# Patient Record
Sex: Female | Born: 1950 | Race: White | Hispanic: No | Smoking: Former smoker
Health system: Southern US, Community
[De-identification: ages and names within clinical notes are randomized; demographics above are authoritative.]

## PROBLEM LIST (undated history)

## (undated) DIAGNOSIS — A159 Respiratory tuberculosis unspecified: Secondary | ICD-10-CM

## (undated) DIAGNOSIS — E042 Nontoxic multinodular goiter: Secondary | ICD-10-CM

## (undated) DIAGNOSIS — IMO0002 Reserved for concepts with insufficient information to code with codable children: Secondary | ICD-10-CM

## (undated) DIAGNOSIS — F419 Anxiety disorder, unspecified: Secondary | ICD-10-CM

## (undated) DIAGNOSIS — R011 Cardiac murmur, unspecified: Secondary | ICD-10-CM

## (undated) DIAGNOSIS — N809 Endometriosis, unspecified: Secondary | ICD-10-CM

## (undated) DIAGNOSIS — I639 Cerebral infarction, unspecified: Secondary | ICD-10-CM

## (undated) DIAGNOSIS — E279 Disorder of adrenal gland, unspecified: Secondary | ICD-10-CM

## (undated) DIAGNOSIS — M47816 Spondylosis without myelopathy or radiculopathy, lumbar region: Secondary | ICD-10-CM

## (undated) DIAGNOSIS — J189 Pneumonia, unspecified organism: Secondary | ICD-10-CM

## (undated) DIAGNOSIS — I10 Essential (primary) hypertension: Secondary | ICD-10-CM

## (undated) DIAGNOSIS — Z87442 Personal history of urinary calculi: Secondary | ICD-10-CM

## (undated) DIAGNOSIS — R0602 Shortness of breath: Secondary | ICD-10-CM

## (undated) DIAGNOSIS — T7840XA Allergy, unspecified, initial encounter: Secondary | ICD-10-CM

## (undated) DIAGNOSIS — Z8672 Personal history of thrombophlebitis: Secondary | ICD-10-CM

## (undated) DIAGNOSIS — I809 Phlebitis and thrombophlebitis of unspecified site: Secondary | ICD-10-CM

## (undated) DIAGNOSIS — H269 Unspecified cataract: Secondary | ICD-10-CM

## (undated) DIAGNOSIS — Z8679 Personal history of other diseases of the circulatory system: Secondary | ICD-10-CM

## (undated) DIAGNOSIS — I499 Cardiac arrhythmia, unspecified: Secondary | ICD-10-CM

## (undated) DIAGNOSIS — M1990 Unspecified osteoarthritis, unspecified site: Secondary | ICD-10-CM

## (undated) DIAGNOSIS — G709 Myoneural disorder, unspecified: Secondary | ICD-10-CM

## (undated) DIAGNOSIS — F32A Depression, unspecified: Secondary | ICD-10-CM

## (undated) DIAGNOSIS — N3010 Interstitial cystitis (chronic) without hematuria: Secondary | ICD-10-CM

## (undated) DIAGNOSIS — R112 Nausea with vomiting, unspecified: Secondary | ICD-10-CM

## (undated) DIAGNOSIS — M4800 Spinal stenosis, site unspecified: Secondary | ICD-10-CM

## (undated) DIAGNOSIS — I739 Peripheral vascular disease, unspecified: Secondary | ICD-10-CM

## (undated) DIAGNOSIS — Z9889 Other specified postprocedural states: Secondary | ICD-10-CM

## (undated) DIAGNOSIS — F329 Major depressive disorder, single episode, unspecified: Secondary | ICD-10-CM

## (undated) HISTORY — DX: Cardiac murmur, unspecified: R01.1

## (undated) HISTORY — DX: Endometriosis, unspecified: N80.9

## (undated) HISTORY — DX: Major depressive disorder, single episode, unspecified: F32.9

## (undated) HISTORY — DX: Personal history of thrombophlebitis: Z86.72

## (undated) HISTORY — DX: Spinal stenosis, site unspecified: M48.00

## (undated) HISTORY — DX: Essential (primary) hypertension: I10

## (undated) HISTORY — DX: Personal history of other diseases of the circulatory system: Z86.79

## (undated) HISTORY — DX: Myoneural disorder, unspecified: G70.9

## (undated) HISTORY — DX: Reserved for concepts with insufficient information to code with codable children: IMO0002

## (undated) HISTORY — PX: EYE SURGERY: SHX253

## (undated) HISTORY — DX: Unspecified osteoarthritis, unspecified site: M19.90

## (undated) HISTORY — DX: Allergy, unspecified, initial encounter: T78.40XA

## (undated) HISTORY — DX: Spondylosis without myelopathy or radiculopathy, lumbar region: M47.816

## (undated) HISTORY — DX: Anxiety disorder, unspecified: F41.9

## (undated) HISTORY — PX: SPINE SURGERY: SHX786

## (undated) HISTORY — DX: Unspecified cataract: H26.9

## (undated) HISTORY — PX: ABDOMINAL HYSTERECTOMY: SHX81

## (undated) HISTORY — DX: Depression, unspecified: F32.A

## (undated) HISTORY — PX: LITHOTRIPSY: SUR834

## (undated) HISTORY — PX: TUBAL LIGATION: SHX77

---
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
## 1976-09-22 DIAGNOSIS — I739 Peripheral vascular disease, unspecified: Secondary | ICD-10-CM

## 1976-09-22 HISTORY — DX: Peripheral vascular disease, unspecified: I73.9

## 1994-09-22 HISTORY — PX: NASAL SINUS SURGERY: SHX719

## 1998-01-16 ENCOUNTER — Other Ambulatory Visit: Admission: RE | Admit: 1998-01-16 | Discharge: 1998-01-16 | Payer: Self-pay | Admitting: Obstetrics and Gynecology

## 1998-04-12 ENCOUNTER — Other Ambulatory Visit: Admission: RE | Admit: 1998-04-12 | Discharge: 1998-04-12 | Payer: Self-pay | Admitting: Obstetrics and Gynecology

## 1998-06-15 ENCOUNTER — Other Ambulatory Visit: Admission: RE | Admit: 1998-06-15 | Discharge: 1998-06-15 | Payer: Self-pay | Admitting: Obstetrics and Gynecology

## 1999-04-15 ENCOUNTER — Other Ambulatory Visit: Admission: RE | Admit: 1999-04-15 | Discharge: 1999-04-15 | Payer: Self-pay | Admitting: Obstetrics and Gynecology

## 2000-08-11 ENCOUNTER — Other Ambulatory Visit: Admission: RE | Admit: 2000-08-11 | Discharge: 2000-08-11 | Payer: Self-pay | Admitting: Obstetrics and Gynecology

## 2002-05-31 ENCOUNTER — Encounter: Payer: Self-pay | Admitting: Family Medicine

## 2002-05-31 ENCOUNTER — Encounter: Admission: RE | Admit: 2002-05-31 | Discharge: 2002-05-31 | Payer: Self-pay | Admitting: Family Medicine

## 2002-09-22 HISTORY — PX: TOTAL ABDOMINAL HYSTERECTOMY W/ BILATERAL SALPINGOOPHORECTOMY: SHX83

## 2002-09-22 HISTORY — PX: LAMINOTOMY: SHX998

## 2002-09-23 ENCOUNTER — Encounter: Payer: Self-pay | Admitting: Obstetrics and Gynecology

## 2002-09-23 ENCOUNTER — Encounter: Admission: RE | Admit: 2002-09-23 | Discharge: 2002-09-23 | Payer: Self-pay | Admitting: Obstetrics and Gynecology

## 2002-11-22 ENCOUNTER — Encounter: Admission: RE | Admit: 2002-11-22 | Discharge: 2002-11-22 | Payer: Self-pay | Admitting: Obstetrics and Gynecology

## 2002-11-22 ENCOUNTER — Encounter: Payer: Self-pay | Admitting: Obstetrics and Gynecology

## 2003-01-06 ENCOUNTER — Encounter: Admission: RE | Admit: 2003-01-06 | Discharge: 2003-01-06 | Payer: Self-pay | Admitting: Family Medicine

## 2003-01-06 ENCOUNTER — Encounter: Payer: Self-pay | Admitting: Family Medicine

## 2003-01-23 ENCOUNTER — Encounter: Payer: Self-pay | Admitting: Obstetrics and Gynecology

## 2003-01-23 ENCOUNTER — Ambulatory Visit (HOSPITAL_COMMUNITY): Admission: RE | Admit: 2003-01-23 | Discharge: 2003-01-23 | Payer: Self-pay | Admitting: Obstetrics and Gynecology

## 2003-02-20 ENCOUNTER — Encounter: Payer: Self-pay | Admitting: Neurosurgery

## 2003-02-23 ENCOUNTER — Encounter: Payer: Self-pay | Admitting: Neurosurgery

## 2003-02-23 ENCOUNTER — Ambulatory Visit (HOSPITAL_COMMUNITY): Admission: RE | Admit: 2003-02-23 | Discharge: 2003-02-23 | Payer: Self-pay | Admitting: Neurosurgery

## 2003-05-02 ENCOUNTER — Ambulatory Visit (HOSPITAL_COMMUNITY): Admission: RE | Admit: 2003-05-02 | Discharge: 2003-05-02 | Payer: Self-pay | Admitting: Obstetrics and Gynecology

## 2003-05-02 ENCOUNTER — Encounter: Payer: Self-pay | Admitting: Obstetrics and Gynecology

## 2003-08-18 ENCOUNTER — Ambulatory Visit (HOSPITAL_COMMUNITY): Admission: RE | Admit: 2003-08-18 | Discharge: 2003-08-18 | Payer: Self-pay | Admitting: Obstetrics and Gynecology

## 2003-08-22 ENCOUNTER — Inpatient Hospital Stay (HOSPITAL_COMMUNITY): Admission: RE | Admit: 2003-08-22 | Discharge: 2003-08-24 | Payer: Self-pay | Admitting: Obstetrics and Gynecology

## 2003-08-22 ENCOUNTER — Encounter (INDEPENDENT_AMBULATORY_CARE_PROVIDER_SITE_OTHER): Payer: Self-pay

## 2003-08-22 ENCOUNTER — Encounter (INDEPENDENT_AMBULATORY_CARE_PROVIDER_SITE_OTHER): Payer: Self-pay | Admitting: Specialist

## 2003-09-23 DIAGNOSIS — I639 Cerebral infarction, unspecified: Secondary | ICD-10-CM

## 2003-09-23 HISTORY — DX: Cerebral infarction, unspecified: I63.9

## 2003-11-14 ENCOUNTER — Other Ambulatory Visit: Admission: RE | Admit: 2003-11-14 | Discharge: 2003-11-14 | Payer: Self-pay | Admitting: Obstetrics and Gynecology

## 2004-01-04 ENCOUNTER — Encounter: Admission: RE | Admit: 2004-01-04 | Discharge: 2004-01-04 | Payer: Self-pay | Admitting: Family Medicine

## 2004-09-19 ENCOUNTER — Encounter: Admission: RE | Admit: 2004-09-19 | Discharge: 2004-09-19 | Payer: Self-pay | Admitting: Specialist

## 2004-12-09 ENCOUNTER — Other Ambulatory Visit: Admission: RE | Admit: 2004-12-09 | Discharge: 2004-12-09 | Payer: Self-pay | Admitting: Obstetrics and Gynecology

## 2006-05-01 ENCOUNTER — Other Ambulatory Visit: Admission: RE | Admit: 2006-05-01 | Discharge: 2006-05-01 | Payer: Self-pay | Admitting: Obstetrics and Gynecology

## 2007-03-17 ENCOUNTER — Ambulatory Visit (HOSPITAL_COMMUNITY): Admission: RE | Admit: 2007-03-17 | Discharge: 2007-03-17 | Payer: Self-pay | Admitting: Family Medicine

## 2008-03-23 ENCOUNTER — Other Ambulatory Visit: Admission: RE | Admit: 2008-03-23 | Discharge: 2008-03-23 | Payer: Self-pay | Admitting: Obstetrics and Gynecology

## 2009-12-05 ENCOUNTER — Other Ambulatory Visit: Admission: RE | Admit: 2009-12-05 | Discharge: 2009-12-05 | Payer: Self-pay | Admitting: Obstetrics and Gynecology

## 2010-10-13 ENCOUNTER — Encounter: Payer: Self-pay | Admitting: Family Medicine

## 2010-10-13 ENCOUNTER — Encounter: Payer: Self-pay | Admitting: Specialist

## 2011-02-07 NOTE — Discharge Summary (Signed)
NAME:  Michele [name], Michele [name]                          ACCOUNT NO.:  [id number]   MEDICAL RECORD NO.:  [id number]                   PATIENT TYPE:  INP   LOCATION:  9308                                 FACILITY:  [facility]   PHYSICIAN:  Artist Pais, M.D.                 DATE OF BIRTH:  [birth year]   DATE OF ADMISSION:  08/22/2003  DATE OF DISCHARGE:  08/24/2003                                 DISCHARGE SUMMARY   CURRENT HISTORY:  The patient is a 60 year old Caucasian female para 2  admitted for a total abdominal hysterectomy and bilateral salpingo-  oophorectomy after she was found to have a large left adnexal mass.  It  showed simple sonographic characteristics measuring 4.9 x 3.6 x 5.3 cm but  did not resolve.  Her CA125 was 5.5.  For further details please see the  dictated History and Physical.   HOSPITAL COURSE:  The patient was admitted and underwent a TAH/BSO without  difficulty.  Peritoneal washings showed no malignant cells and the final  diagnosis was a benign ovarian serous cystadenoma, benign fallopian tubes,  some slight cervicitis, weakly proliferative endometrium, and intramural  leiomyoma.  The right and left ovaries were benign but the mass was a benign  serous cystadenoma.  The patient was admitted after surgery and on the  evening of surgery she achieving excellent analgesia.  She had no complaints  except for some gas pain.  Urine output was excellent and vital signs were  stable.  Her blood pressure was slightly elevated which resolved when she  received her Serzone and nicotine patches.  On postoperative day #1 she was  feeling very well.  Her IV was discontinued and p.o. analgesics were  started.  Her white count was 11,800 and hemoglobin was 11.6 with a 234,000  platelet count.  There was no CVA tenderness, no calf tenderness, and very  scant vaginal drainage.  On postoperative day #2 the patient wanted to go  home.  Lungs were clear.  Cardiac exam revealed the heart to be  regular rate  and rhythm.  She had very scant vaginal bleeding.  Her abdomen was soft and  nontender and she was passing flatus.  She did tolerate a regular diet.  She  was subsequently discharged home on August 24, 2003.  She was given  extensive discharge instructions include a prescription for Tylox dispense  #30 to take one to two q.4-6h. as needed for pain.  She was also to take her  Serzone per her bottle directions.  She will call Dr. Leonides Sake for her  nicotine patch and use glycerin suppositories for constipation.  She is  urged to ambulate daily to decrease the risk of a DVT or pulmonary embolism.  She agreed to call if her temperature was greater than 101 persistently or  if she had heavy vaginal bleeding.  She did receive the postoperative  hysterectomy pamphlet,  Recovering From Your Surgery which gave her further  instructions.  She was urged to return to the office within a week to have  her staples removed.  Incision was noted to be clean, dry, and intact.                                               Artist Pais, M.D.    DC/MEDQ  D:  09/22/2003  T:  09/22/2003  Job:  [id number]   cc:   Holley Bouche, M.D.  [address]  [place], [place] [address]  Fax: [phone]

## 2011-02-07 NOTE — H&P (Signed)
NAME:  Michele [name], Michele [name]                          ACCOUNT NO.:  [id number]   MEDICAL RECORD NO.:  [id number]                   PATIENT TYPE:   LOCATION:                                       [facility]   PHYSICIAN:  Artist Pais, M.D.                 DATE OF BIRTH:  Sep 14, 1951   DATE OF ADMISSION:  08/22/2003  DATE OF DISCHARGE:                                HISTORY & PHYSICAL   HISTORY OF PRESENT ILLNESS:  The patient is a 60 year old Caucasian female,  para 2, admitted for a total abdominal hysterectomy and bilateral salpingo-  oophorectomy.  The patient was found to have a left adnexal mass which was  palpated during physical exam.  This was confirmed by pelvic ultrasound on  September 23, 2002.  The left ovary was found to have a cyst, which was simple  in sonographic characteristics, measuring 4.9 x 3.6 x 5.3 cm.  This has been  monitored and has been stable, but the patient has noted increasing pain and  desires this to be removed.  Because she is menopausal and this is an  ovarian neoplasm of uncertain etiology, but thought to be benign, she is  admitted for a total abdominal hysterectomy and bilateral salpingo-  oophorectomy.  The risks of surgery, including anesthetic complication,  hemorrhage, infection, damage to adjacent structures, including bladder,  bowel, blood vessels, or ureter, were discussed with the patient.  She is  made aware that she has an increased risk for bladder damage due to her  history of cesarean section.  She expresses understanding of and acceptance  of these risks.  She has desired not to have surgery before now because she  wanted to recuperate from her back surgery.  When I saw her last week for  her preoperative visit, she says that this still was present even before I  examined her because she continues to have pain.  She fully understands that  even with removal of this ovarian mass that she could still have pain.  Initially her CA125 was 5.5,  which is normal.  I repeated her CA125 on  August 14, 2003.  This was found to be normal at 5.3.  A repeat pelvic  ultrasound on August 18, 2003, revealed the left ovarian cyst to be 4.9 x  4.6 x 3.4 cm and this was found to be stable.  Thus, my suspicion that this  is in any way malignant has decreased given that one dimension has appeared  to perhaps decreased in size, but given the fact that this just appears to  be a simple cyst with all benign ultrasound characteristics and a normal  CA125, this was elected to be performed by me as a benign gynecologic  surgeon and not referred to a cancer specialist.  The patient is fully  amenable with this plan.  She also has a distant history of endometriosis.  OBSTETRICAL HISTORY:  Menarche at age 14.  Contraception NA.  The patient  became menopausal in September of 2000.  She has no history of an abnormal  Pap.  Her last Pap performed was on September 12, 2002, and was found to be  within normal limits.   PAST MEDICAL HISTORY:  1. Depression.  2. Anxiety.  3. History of phlebitis.  This was not a DVT, but was just some inflammation     around the vein from where she had an IV site.   PAST SURGICAL HISTORY:  1. Sinus surgery in 1996.  2. Cesarean section in 1985.  3. Spontaneous vaginal delivery in 1986.  4. T&A in 1958.  5. Appendectomy in 1957.  6. Microdiskectomy done in the summer of this year.   CURRENT MEDICATIONS:  Serzone and Flexeril p.r.n.   ALLERGIES:  LATEX.   FAMILY HISTORY:  There is no family history of colon or ovarian cancer.  The  patient's mother has hypertension and heart disease, as well as anxiety and  arthritis.  She also has a history of cervical cancer.  The patient's father  is 57 with prostate cancer, hypertension, and heart disease.  One sister,  age 67, with premenopausal breast cancer.  One sister, age 51, with  hepatitis C, fibromyalgia, hypertension, and heart disease.  Two brothers,  ages 54 and  92, alive and well.  Her 105 year old brother has a history of  hemochromatosis.   SOCIAL HISTORY:  The patient is an Print production planner for [employer].  Tobacco:  The patient smokes one-half pack per day of cigarettes.  She is  attempting smoking cessation.  Alcohol:  Occasional.   REVIEW OF SYSTEMS:  Noncontributory, except as noted above.  Denies  headaches, visual changes, chest pain, shortness of breath, abdominal pain,  change in bowel habits, unintentional weight loss, dysuria, urgency,  frequency, vaginal pruritus or discharge, and pain or bleeding with  intercourse.   PHYSICAL EXAMINATION:  GENERAL APPEARANCE:  A well-developed, Caucasian  female.  VITAL SIGNS:  Blood pressure 126/84, heart rate 58.  WEIGHT:  154 pounds.  HEIGHT:  5 feet 3-1/2 inches.  HEENT:  Normal.  NECK:  Without thyromegaly, adenopathy, or nodules.  CHEST:  Clear to auscultation.  BREASTS:  Symmetrical without masses.  No dimples, retraction, or nipple  discharge.  CARDIAC:  Regular rate and rhythm without murmurs, rubs, or gallops.  ABDOMEN:  Soft and nontender.  No hepatosplenomegaly or masses.  EXTREMITIES:  No cyanosis, clubbing, or edema.  NEUROLOGIC:  Oriented x 3.  Grossly normal.  PELVIC:  Normal external female genitalia.  No vulvar, vaginal, or cervical  lesions.  Pap smear as mentioned above performed in December of 2003 was  within normal limits.  Bimanual examination reveals the uterus to be normal,  mobile, and nontender, but I am able to still palpate her left adnexal mass,  which is smooth and palpates to be approximately 4-5 cm, which was initially  noted on her September 12, 2002, exam.  RECTAL:  Confirms pelvic exam.  No masses palpated.   IMPRESSION AND PLAN:  The patient is a 60 year old Caucasian female with an  18-month history of a 5 cm ovarian simple cyst which has been stable, but which causes the patient significant pain and she desires removal of this  mass.  Because the  patient has had surgery before and with each entry into  the abdomen there is an increased risk of bowel injury and given the fact  that she is menopausal, I have counseled her to have a total abdominal  hysterectomy and bilateral salpingo-oophorectomy, which she very much  prefers to have.  All risks have been explained to her.  She also will do a  bowel prep and is amenable with doing this.  All questions have been  answered and the patient desires to proceed with surgery.                                               Artist Pais, M.D.    DC/MEDQ  D:  08/21/2003  T:  08/21/2003  Job:  [id number]

## 2011-02-07 NOTE — Op Note (Signed)
NAME:  Michele [name], Michele [name]                          ACCOUNT NO.:  [id number]   MEDICAL RECORD NO.:  [id number]                   PATIENT TYPE:  INP   LOCATION:  9399                                 FACILITY:  [facility]   PHYSICIAN:  Artist Pais, M.D.                 DATE OF BIRTH:  Nov 06, 1950   DATE OF PROCEDURE:  DATE OF DISCHARGE:                                 OPERATIVE REPORT   PREOPERATIVE DIAGNOSIS:  Persistent left ovarian cyst, left pelvic pain and  dyspareunia as a result of the cyst.   POSTOPERATIVE DIAGNOSIS:  Persistent left ovarian cyst, left pelvic pain and  dyspareunia as a result of the cyst; left ovarian cyst found to be benign.   PROCEDURE:  Total abdominal hysterectomy, bilateral salpingo-oophorectomy.   ANESTHESIA:  General endotracheal.   SURGEON:  Artist Pais, M.D., Ph.D.   ASSISTANT:  Leonette Most A. Delcambre, MD   FLUIDS:  1900 cc of Crystalloid plus 1 g of Ancef IV.   DRAINS:  Foley.   ESTIMATED BLOOD LOSS:  150 cc.   URINE OUTPUT:  150 cc.   COMPLICATIONS:  None.   FINDINGS:  Benign left ovarian cyst on frozen section, also endometriotic  implants in the cul-de-sac which resulted in obliteration of the cul-de-sac.   DESCRIPTION OF OPERATION:  The patient was brought to the operating room and  laid flat on the operating room table.  After induction of adequate general  endotracheal anesthesia, the patient was prepped and draped in the usual  sterile fashion and the Foley catheter was placed.  Everything was latex-  free as she has a latex allergy.  The patient was subsequently placed in the  supine position and prepped and draped in the usual sterile fashion.  A  Foley catheter was placed at this time.  The patient had a previous cesarean  section and a Pfannenstiel incision was made through her cesarean section  incision and carried down to the fascia.  The fascia was scored in the  midline and extended bilaterally using Mayo scissors.  It was then  separated  free from the underlying muscle.  The patient was placed in Trendelenburg  and the peritoneum was entered very gently and carefully, taking care to  avoid bowel or other abdominal contents.  This was done in a very careful  fashion.   The peritoneal incision was extended superiorly and then inferiorly down to  the bladder edge.  The abdomen was then explored after washings were taken.  The liver edge was noted to be smooth.  There were no enlarged pelvic or  para-aortic nodes.  The left ovarian mass was noted to be mobile and benign  in appearance.  There was no evidence of any peritoneal disease.   Subsequently the O'Connor-O'Sullivan retractor was placed and the bowel was  prepped out of the operative field.  All Kelly clamps were placed at either  angle  of the uterus and the uterus was elevated to the area of the abdominal  incision.  The right round ligament was identified, ligated with a figure-of-  eight suture of 0 Monocryl, divided with the Bovie, and the anterior leaf of  the broad ligament was divided to the area overlying the internal os.  This  was accomplished on the left as well in a similar fashion.  The ureter was  identified on the right and noted to be well down and away from the ovary.  Subsequently a clear space was developed in the posterior leaf of the broad  ligament and a curved Heaney clamp was placed across the infundibulopelvic  ligament.  This was then divided, and a free tie of 0 Monocryl was placed  and a subsequent suture of 0 Monocryl was placed.  Excellent hemostasis was  noted at the infundibulopelvic ligament pedicle.  This was accomplished on  the left in a similar fashion.   Because of the patient's cesarean section, the bladder was noted to be very  adherent, but using very careful sharp dissection and just a small amount of  blunt dissection, the bladder was taken down off the cervix.  This proceeded  very tediously in order to mobilize  the bladder off the cervix.  Great care  was taken to ensure no implant cystotomy.  Subsequently the vessels were  skeletonized on the right and a curved Heaney clamp was used to clamp the  vessels on the right, which were cut and ligated with a figure-of-eight  suture of 0 Monocryl.  This was accomplished on the left in a similar  fashion.  Subsequent cardinal ligament bites were taken and ligated with  sutures of 0 Monocryl.  The bladder was noted to be well down distal to the  cervix.  It should be noted that much of the operative time was used to  mobilize the bladder off of the cervix.  Subsequently a curved Heaney was  placed on the left at the uterosacral ligament, and a figure-of-eight suture  of 0 Monocryl was placed after dividing this from the adjacent tissues.  The  vagina was noted to have been entered.  This was accomplished on the right  in a similar fashion.  A curved Heaney was placed across the uterosacral  ligament at the distal end of the cervix, and the vagina was then entered  after the tissue was divided.  A figure-of-eight suture of 0 Monocryl was  placed.  Using Satinsky scissors, the cervix was separated from the vaginal  tissue.  Kocher clamps were placed on the vaginal cuff and the vaginal cuff  was closed using figure-of-eight sutures of 0 Monocryl.  Two figure-of-eight  angle sutures of 0 Monocryl were placed at either angle, ensuring with great  care that any raw tissues of the vaginal cuff were incorporated into the  angle suture.  In closing the vagina with figure-of-eight sutures of 0  Monocryl, great care was taken to ensure that there were no raw edges of  vagina to decrease the risk of bleeding.   Subsequently the pelvis was irrigated copiously with warm lactated Ringer's  and all pedicles were inspected and noted to be extremely hemostatic.  There was noted to be one oozing area at the posterior peritoneum and the  uterosacral ligaments were plicated,  incorporating this raw area of  posterior peritoneum in a figure-of-eight suture of 0 Monocryl with  subsequent excellent hemostasis noted.  There were noted to be  endometriotic  implants in the cul-de-sac which almost obliterated the cul-de-sac, but the  endometriotic implants were removed with the specimen.  The pathologist then  phoned in that the cyst was noted to be benign.   At that point, the patient was taken out of Trendelenburg after noting that  there was excellent hemostasis.  The O'Connor-O'Sullivan retractor was  removed.  It should be noted that the bladder blade and abdominal wall  retractor had been placed with the O'Connor-O'Sullivan retractor.  The bowel  packs were removed and all instruments, sponge and needle counts were  correct at that point.  Hemostats were placed over the peritoneum and the  peritoneum was closed using a simple running suture of 0 Monocryl.  The  subfascial areas were examined for evidence of hemostasis and excellent  hemostasis was achieved using cautery; however, the area was noted to be  mostly hemostatic.  The fascia was then closed using two sutures of 0 PDS  with each suture anchored at either angle of the incision, run through the  midline and tied.  Excellent fascial closure was ensured.   Subsequently the subcutaneous tissue was irrigated copiously with warm  lactated Ringer's and excellent hemostasis was achieved using cautery.  The  skin was closed with staples.  A pressure dressing was placed using paper  tape due to the patient's sensitivity.   The patient tolerated the procedure well without apparent complications and  was transferred to the recovery room in stable condition after all  instrument, sponge, and needle counts were correct.                                               Artist Pais, M.D.    DC/MEDQ  D:  08/22/2003  T:  08/22/2003  Job:  [id number]   cc:   [facility]  [address] [place]. [address].

## 2011-02-07 NOTE — Op Note (Signed)
NAME:  Michele [name], Michele [name]                          ACCOUNT NO.:  [id number]   MEDICAL RECORD NO.:  [id number]                   PATIENT TYPE:  OIB   LOCATION:  2871                                 FACILITY:  [facility]   PHYSICIAN:  Reinaldo Meeker, M.D.              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   OPERATION PERFORMED:  Right L5-S1 interlaminar laminotomy for excision of  herniated disk with operating microscope.   SECONDARY PROCEDURE:  Microdissection of L5-S1 disk and S1 nerve root.   SURGEON:  Reinaldo Meeker, M.D.   ASSISTANT:  Kathaleen Maser. Pool, M.D.   ANESTHESIA:  General.   DESCRIPTION OF PROCEDURE:  After being placed in prone position, the  patient's back was prepped and draped in the usual sterile fashion.  A  localizing x-ray was taken prior to incision to identify the appropriate  level.  A midline incision was made above the spinous processes of L5 and  S1.  Using Bovie cutting current, the incision was carried down to spinous  processes.  Subperiosteal dissection was then carried out on the right side  of the spinous processes and lamina and the McCullough self-retaining  retractor was placed for exposure.  A second x-ray showed approach to the  appropriate level.  Using a high speed drill the inferior one third of the  L5 lamina and the medial one third of the facet joint were removed.  It was  then used to remove the superior one half of the S1 lamina.  Residual bone  and ligamentum flavum were removed in a piecemeal fashion.  The microscope  was draped and brought into the field and used for the remainder of the  case.  Using microdissection technique, the lateral aspect of the thecal sac  and S1 nerve root were identified.  Further coagulation was carried  out down  to the floor of the canal to identify the L5-S1 disk which was found to be  markedly herniated beneath the nerve root.  After coagulating out the  annulus, the annulus was incised with a 15 blade.  Using pituitary rongeurs  and curets, a very thorough disk space cleanout was then carried out and at  the same time great care was taken to avoid injury to the neural elements.  This was successfully done.  At this point inspection was carried out in all  directions for any evidence of residual compression and none could be  identified.  Large amounts of irrigation were carried out and any bleeding  controlled with bipolar coagulation and Gelfoam.  The wound was then closed  using interrupted Vicryl in the muscle, fascia, subcutaneous and  subcuticular tissues and staples on the  skin.  A sterile dressing was then  applied and the patient was extubated and taken to the recovery room in  stable condition.                                                Reinaldo Meeker, M.D.    ROK/MEDQ  D:  02/23/2003  T:  02/23/2003  Job:  [id number]

## 2012-01-07 ENCOUNTER — Encounter (HOSPITAL_COMMUNITY): Payer: Self-pay | Admitting: *Deleted

## 2012-01-07 ENCOUNTER — Emergency Department (HOSPITAL_COMMUNITY)
Admission: EM | Admit: 2012-01-07 | Discharge: 2012-01-07 | Discharge status: Against Medical Advice | Payer: Self-pay | Attending: Emergency Medicine | Admitting: Emergency Medicine

## 2012-01-07 DIAGNOSIS — M545 Low back pain, unspecified: Secondary | ICD-10-CM | POA: Insufficient documentation

## 2012-01-07 HISTORY — DX: Interstitial cystitis (chronic) without hematuria: N30.10

## 2012-01-07 HISTORY — DX: Reserved for concepts with insufficient information to code with codable children: IMO0002

## 2012-01-07 NOTE — ED Notes (Signed)
Pt has hx of bulging disc - pt reports getting up from air mattress and felt a "pop" in lower back.  C/o pain to area.

## 2012-01-07 NOTE — ED Notes (Signed)
Pt also reports active kidney stone with hematuria.

## 2012-04-01 ENCOUNTER — Ambulatory Visit
Admission: RE | Admit: 2012-04-01 | Discharge: 2012-04-01 | Disposition: A | Discharge status: Home / Self Care | Payer: 59 | Source: Ambulatory Visit | Attending: Physician Assistant | Admitting: Physician Assistant

## 2012-04-01 ENCOUNTER — Other Ambulatory Visit: Payer: Self-pay | Admitting: Physician Assistant

## 2012-04-01 DIAGNOSIS — R05 Cough: Secondary | ICD-10-CM

## 2012-05-07 ENCOUNTER — Institutional Professional Consult (permissible substitution): Payer: 59 | Admitting: Internal Medicine

## 2012-06-10 ENCOUNTER — Other Ambulatory Visit (HOSPITAL_COMMUNITY)
Admission: RE | Admit: 2012-06-10 | Discharge: 2012-06-10 | Disposition: A | Discharge status: Home / Self Care | Payer: 59 | Source: Ambulatory Visit | Attending: Family Medicine | Admitting: Family Medicine

## 2012-06-10 ENCOUNTER — Other Ambulatory Visit: Payer: Self-pay | Admitting: Physician Assistant

## 2012-06-10 DIAGNOSIS — Z0000 Encounter for general adult medical examination without abnormal findings: Secondary | ICD-10-CM | POA: Insufficient documentation

## 2013-08-31 DIAGNOSIS — Z8679 Personal history of other diseases of the circulatory system: Secondary | ICD-10-CM

## 2013-08-31 HISTORY — DX: Personal history of other diseases of the circulatory system: Z86.79

## 2013-09-21 ENCOUNTER — Other Ambulatory Visit: Payer: Self-pay

## 2013-09-21 ENCOUNTER — Encounter (INDEPENDENT_AMBULATORY_CARE_PROVIDER_SITE_OTHER): Payer: BC Managed Care – PPO

## 2013-09-21 ENCOUNTER — Encounter: Payer: Self-pay | Admitting: *Deleted

## 2013-09-21 DIAGNOSIS — R42 Dizziness and giddiness: Secondary | ICD-10-CM

## 2013-09-21 DIAGNOSIS — I499 Cardiac arrhythmia, unspecified: Secondary | ICD-10-CM

## 2013-09-21 DIAGNOSIS — I498 Other specified cardiac arrhythmias: Secondary | ICD-10-CM

## 2013-09-21 NOTE — Progress Notes (Signed)
Patient ID: Michele [name], female   DOB: 06/07/1951, 62 y.o.   MRN: [id number] E-Cardio 48 hour holter monitor applied to patient.

## 2013-10-11 ENCOUNTER — Other Ambulatory Visit: Payer: Self-pay | Admitting: *Deleted

## 2013-10-11 ENCOUNTER — Encounter: Payer: Self-pay | Admitting: *Deleted

## 2013-10-11 ENCOUNTER — Encounter: Payer: Self-pay | Admitting: Cardiology

## 2013-10-11 ENCOUNTER — Encounter (INDEPENDENT_AMBULATORY_CARE_PROVIDER_SITE_OTHER): Payer: Self-pay

## 2013-10-11 ENCOUNTER — Ambulatory Visit (INDEPENDENT_AMBULATORY_CARE_PROVIDER_SITE_OTHER): Payer: BC Managed Care – PPO | Admitting: Cardiology

## 2013-10-11 VITALS — BP 136/72 | HR 121 | Ht 63.0 in | Wt 127.0 lb

## 2013-10-11 DIAGNOSIS — Z8673 Personal history of transient ischemic attack (TIA), and cerebral infarction without residual deficits: Secondary | ICD-10-CM | POA: Insufficient documentation

## 2013-10-11 DIAGNOSIS — R42 Dizziness and giddiness: Secondary | ICD-10-CM

## 2013-10-11 DIAGNOSIS — I951 Orthostatic hypotension: Secondary | ICD-10-CM

## 2013-10-11 DIAGNOSIS — R000 Tachycardia, unspecified: Secondary | ICD-10-CM

## 2013-10-11 DIAGNOSIS — G459 Transient cerebral ischemic attack, unspecified: Secondary | ICD-10-CM | POA: Insufficient documentation

## 2013-10-11 DIAGNOSIS — I498 Other specified cardiac arrhythmias: Secondary | ICD-10-CM

## 2013-10-11 DIAGNOSIS — G90A Postural orthostatic tachycardia syndrome (POTS): Secondary | ICD-10-CM | POA: Insufficient documentation

## 2013-10-11 DIAGNOSIS — I493 Ventricular premature depolarization: Secondary | ICD-10-CM | POA: Insufficient documentation

## 2013-10-11 DIAGNOSIS — I4949 Other premature depolarization: Secondary | ICD-10-CM

## 2013-10-11 MED ORDER — FLUDROCORTISONE ACETATE 0.1 MG PO TABS: 0.2000 mg | ORAL_TABLET | Freq: Every day | ORAL | Status: DC

## 2013-10-11 MED ORDER — METOPROLOL TARTRATE 25 MG PO TABS: 25.0000 mg | ORAL_TABLET | Freq: Two times a day (BID) | ORAL | Status: DC

## 2013-10-11 MED ORDER — FLUDROCORTISONE ACETATE 0.1 MG PO TABS: 0.1000 mg | ORAL_TABLET | Freq: Every day | ORAL | Status: DC

## 2013-10-11 NOTE — Progress Notes (Signed)
Patient ID: Michele [name], female   DOB: [birth year], 63 y.o.   MRN: [id number] PCP: Dr. 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  For the last 6 months, she has had spells of profuse sweating.  She has also felt frequent palpitations.  Holter monitor showed very frequent (11.6% of total) PVCs.  Main complaint, however, has been lightheadedness with ambulation.  She will note lightheadedness when she stands up and walks across a room.  She will feel her heart pounding.  Sometimes her vision will "go black" and she will have to lean against a wall.  She has not passed out.  She does not get chest pain but does feel a "weird" feeling in her chest when she is walking.  She attributes this to her heart racing/pounding.   She had orthostatics done in the office today.  Blood pressure did not fall with standing, but HR did increase from 87 => 120 (sinus tachycardia).   Labs (12/14): K 4.3, creatinine 0.82, TSH normal, free T4 normal  PMH: 1. Suspected POTS. 2. Depression 3. TAH/BSO 4. OA spine 5. ? TIA in 2003.  6. Stress test 2-3 years ago normal per patient's report.  7. PVCs: Holter (12/14) with frequent PVCs, 11.6% of total beats.   FH: Grandmother with PCM, grandfather with MI and CVA.  SH: Quit smoking in 7/03, rare ETOH, widow with 2 children, lives in [place], unemployed.   ROS: All systems reviewed and negative except as per HPI.   Current Outpatient Prescriptions  Medication Sig Dispense Refill  . ALPRAZolam (XANAX) 0.5 MG tablet Take 1 tablet by mouth as needed.      Marland Kitchen HYDROcodone-acetaminophen (NORCO) 10-325 MG per tablet Take 1 tablet by mouth every 6 (six) hours as needed.      Marland Kitchen albuterol (PROVENTIL HFA;VENTOLIN HFA) 108 (90 BASE) MCG/ACT inhaler Inhale 2 puffs into the lungs every 4 (four) hours as needed for wheezing or shortness of breath.      Marland Kitchen aspirin EC 81 MG tablet Take 1 tablet  (81 mg total) by mouth daily.      . cetirizine (ZYRTEC) 10 MG tablet Take 10 mg by mouth daily.      . fludrocortisone (FLORINEF) 0.1 MG tablet Take 1 tablet (0.1 mg total) by mouth daily.  30 tablet  3  . fluticasone (FLONASE) 50 MCG/ACT nasal spray Place 2 sprays into both nostrils daily.      . Fluticasone-Salmeterol (ADVAIR) 250-50 MCG/DOSE AEPB Inhale 1 puff into the lungs 2 (two) times daily.      . metoprolol tartrate (LOPRESSOR) 25 MG tablet Take 1 tablet (25 mg total) by mouth 2 (two) times daily.  60 tablet  3  . predniSONE (DELTASONE) 20 MG tablet Take 20 mg by mouth as needed.       No current facility-administered medications for this visit.    BP 136/72  Pulse 121  Ht 5\' 3"  (1.6 m)  Wt 57.607 kg (127 lb)  BMI 22.50 kg/m2 General: NAD Neck: No JVD, no thyromegaly or thyroid nodule.  Lungs: Clear to auscultation bilaterally with normal respiratory effort. CV: Nondisplaced PMI.  Heart regular S1/S2, no S3/S4, no murmur.  No peripheral edema.  No carotid bruit.  Normal pedal pulses.  Abdomen: Soft, nontender, no hepatosplenomegaly, no distention.  Skin: Intact without lesions or rashes.  Neurologic: Alert and oriented x 3.  Psych: Normal affect. Extremities: No clubbing or  cyanosis.  HEENT: Normal.   Assessment/Plan: 1. 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  Her symptoms fit.  Her HR increased excessively with standing today in the office (BP remained stable).  She is quite symptomatic.  - I will have her try fludrocortisone 0.1 mg daily to see if this helps.  - She will wear knee-high compression stockings.  2. PVCs: Very frequent PVCs on holter (11.6%).  Frequent PVCs can cause a cardiomyopathy, usually occurs with > 15% PVCs.  She is symptomatic with palpitations.   - Echo to assess EF and to look for structural abnormalities.  - Start metoprolol 25 mg bid.  3. ?TIA: Given possible prior  TIA, I recommended ASA 81 mg daily.   Michele [name] 63/20/2015

## 2013-10-11 NOTE — Patient Instructions (Signed)
Start aspirin 81mg  daily. Take this with food.  Start metoprolol tartrate 25mg  two times a day.  Start Florinef 0.1mg  daily.   Use knee highgraded compression stockings. Put them on in the morning and take them off at night. I have given you a prescription for this.   Your physician has requested that you have an echocardiogram. Echocardiography is a painless test that uses sound waves to create images of your heart. It provides your doctor with information about the size and shape of your heart and how well your heart's chambers and valves are working. This procedure takes approximately one hour. There are no restrictions for this procedure.  Your physician recommends that you schedule a follow-up appointment in: 1 month with Dr Aundra Dubin.

## 2013-10-17 ENCOUNTER — Ambulatory Visit (INDEPENDENT_AMBULATORY_CARE_PROVIDER_SITE_OTHER): Payer: BC Managed Care – PPO | Admitting: Neurology

## 2013-10-17 ENCOUNTER — Encounter: Payer: Self-pay | Admitting: Neurology

## 2013-10-17 VITALS — BP 107/64 | HR 59 | Ht 64.0 in | Wt 135.0 lb

## 2013-10-17 DIAGNOSIS — G5600 Carpal tunnel syndrome, unspecified upper limb: Secondary | ICD-10-CM

## 2013-10-17 DIAGNOSIS — R51 Headache: Secondary | ICD-10-CM

## 2013-10-17 DIAGNOSIS — M4802 Spinal stenosis, cervical region: Secondary | ICD-10-CM

## 2013-10-17 DIAGNOSIS — R519 Headache, unspecified: Secondary | ICD-10-CM | POA: Insufficient documentation

## 2013-10-17 DIAGNOSIS — R209 Unspecified disturbances of skin sensation: Secondary | ICD-10-CM

## 2013-10-17 DIAGNOSIS — M542 Cervicalgia: Secondary | ICD-10-CM | POA: Insufficient documentation

## 2013-10-17 MED ORDER — TOPIRAMATE 25 MG PO TABS: 25.0000 mg | ORAL_TABLET | Freq: Two times a day (BID) | ORAL | Status: DC

## 2013-10-17 NOTE — Progress Notes (Signed)
[facility] [address] [place]. [place] [address] [phone]       OFFICE CONSULT NOTE  Michele. Michele [name] Date of Birth:  Nov 25, 1950 Medical Record Number:  [id number]   Referring MD:  Michele [name]  Reason for Referral:  numbness  HPI: Michele [name] is a 85 year pleasant Caucasian lady whose had chronic neck pain and paresthesias her last 4-5 years which seems to have gotten worse in the last year or so. She has known history of cervical spinal stenosis which was diagnosed first in 2004 when she had imaging studies. She underwent back surgery at that time. Over the years and neck pain and intermittent hand paresthesias into the gotten worse. She describes these as happening intermittently and is related to change in neck or body position. She describes numbness involving whole left choroid upper extremity which is relieved by changing position. She also describes pain in the back of the neck and at times it shoots up into the back of her head. This is constant. She describes soreness and tenderness of her neck muscles. She also describes an intermittent transient sharp shooting headaches which last for a few seconds and a severe. She has been taking the Norco pain medication which provides good relief but does not help the numbness. She has tried gabapentin in the past but it did not help her numbness. She was seen by Dr. Ace [name] from pain management who did nerve conduction study a year ago and diagnosed her with carpal tunnel on the right. She does have a wrist extension splint but she uses it only at night. She does do Pilates and yoga regularly and plans to start swimming soon as well. She underwent MRI scan of the cervical spine [place] 09/10/39 which I have personally reviewed and shows severe spinal stenosis at C4-5 and C5-6 with AP canal diameter reduction to 6 and 7 mm respectively with disc protrusion. There is possible left C4 nerve root impingement due to bony spur. This  apparently this has progressed compared to previous MRI scan from 10 years ago but I do not have those films to look at today. I do not have the local infection results either but the patient informs me that EMG was not done. She is keen to avoid surgery in the neck and would like to try nonsurgical treatment if possible. On inquiry she admits to intermittent balance difficulties and she has had several falls the last one year but none of them was a major involving significant injury. She does not have any significant bladder urgency or incontinence. She has no significant weakness in her hands or legs. She does admit to some diminished fine motor skills and mild tremulousness of her hands while performing certain tasks.  ROS:   14 system review of systems is positive for weight gain and loss, fatigue, chest pain, palpitations, loss of vision, shortness of breath, trouble swallowing, chewing, moles, easy bruising, feeling hot and cold, flushing, joint pain, aching muscles, runny nose, memory loss, confusion, headache, numbness, weakness, dizziness, tremor, depression, anxiety, decreased energy, insomnia and disinterest in activities.  PMH:  Past Medical History  Diagnosis Date  . Herniated disc     L5-S1  . Interstitial cystitis   . Kidney stone   . Depression   . Anxiety   . History of phlebitis     from IV  . Lumbar spondylosis   . Spinal stenosis   . Endometriosis   . History of orthostatic hypotension 08-31-13  Social History:  History   Social History  . Marital Status: Married    Spouse Name: N/A    Number of Children: 2  . Years of Education: college2   Occupational History  . retired    Social History Main Topics  . Smoking status: Former Research scientist (life sciences)  . Smokeless tobacco: Not on file     Comment: quit july 2013  . Alcohol Use: No  . Drug Use: No  . Sexual Activity: Not on file   Other Topics Concern  . Not on file   Social History Narrative  . No narrative on file      Medications:   Current Outpatient Prescriptions on File Prior to Visit  Medication Sig Dispense Refill  . ALPRAZolam (XANAX) 0.5 MG tablet Take 1 tablet by mouth as needed.      Marland Kitchen aspirin EC 81 MG tablet Take 1 tablet (81 mg total) by mouth daily.      . fludrocortisone (FLORINEF) 0.1 MG tablet Take 1 tablet (0.1 mg total) by mouth daily.  30 tablet  3  . HYDROcodone-acetaminophen (NORCO) 10-325 MG per tablet Take 1 tablet by mouth every 6 (six) hours as needed.      . metoprolol tartrate (LOPRESSOR) 25 MG tablet Take 1 tablet (25 mg total) by mouth 2 (two) times daily.  60 tablet  3   No current facility-administered medications on file prior to visit.    Allergies:   Allergies  Allergen Reactions  . Iodine Rash  . Latex Rash  . Shellfish Allergy Rash    Physical Exam General: well developed, well nourished middle aged Caucasian lady, seated, in no evident distress Head: head normocephalic and atraumatic. Orohparynx benign Neck: supple with no carotid or supraclavicular bruits Cardiovascular: regular rate and rhythm, no murmurs Musculoskeletal: no deformity Skin:  no rash/petichiae Vascular:  Normal pulses all extremities Filed Vitals:   10/17/13 1449  BP: 107/64  Pulse: 59    Neurologic Exam Mental Status: Awake and fully alert. Oriented to place and time. Recent and remote memory intact. Attention span, concentration and fund of knowledge appropriate. Mood and affect appropriate.  Cranial Nerves: Fundoscopic exam reveals sharp disc margins. Pupils equal, briskly reactive to light. Extraocular movements full without nystagmus. Visual fields full to confrontation. Hearing intact. Facial sensation intact. Face, tongue, palate moves normally and symmetrically.  Motor: Normal bulk and tone. Normal strength in all tested extremity muscles. Sensory.: intact to touch and pinprick and position sense Tinel's sign negative over both wrists. Slight diminished vibration over toes  bilaterally Coordination: Rapid alternating movements normal in all extremities. Finger-to-nose and heel-to-shin performed accurately bilaterally. Gait and Station: Arises from chair without difficulty. Stance is normal. Gait demonstrates normal stride length and balance . Able to heel, toe and tandem walk without difficulty.  Reflexes: 2+ and symmetric and brisk in upper extremities and both knee jerks 1+ and ankle jerks depressed. Toes downgoing.   ASSESSMENT: 28 year lady long-standing history of intermittent hand paresthesias and chronic neck pain likely from degenerative cervical spine disease with spinal stenosis as well as a component of carpal tunnel syndrome. Chronic transient headaches likely also of musculoskeletal etiology    PLAN: I had a long discussion with the patient regarding the results of her prior studies, my clinical evaluation, treatment plan and answered questions.Recommend trial of Topamax for paresthesias and headache. Repeat EMG with nerve conduction study to distinguish contribution of cervical radiculopathy over carpal tunnel. I advised the patient to wear wrist extension  splints at all times and avoid activities with rapid repetitive wrist flexion movements. Return for followup in 6 weeks or call earlier if necessary   Note: This document was prepared with digital dictation and possible smart phrase technology. Any transcriptional errors that result from this process are unintentional.

## 2013-10-17 NOTE — Patient Instructions (Signed)
I had a long discussion with the patient regarding her paresthesias, discuss results of her MRI scan of the neck and previous nerve conduction study and answered questions. Recommend trial of Topamax for paresthesias and headache. Repeat EMG with nerve conduction study to distinguish contribution of cervical radiculopathy over carpal tunnel. I advised the patient to wear wrist extension splints at all times and avoid activities with rapid repetitive wrist flexion movements. Return for followup in 6 weeks or call earlier if necessary

## 2013-10-18 ENCOUNTER — Other Ambulatory Visit: Payer: Self-pay | Admitting: Family Medicine

## 2013-10-18 DIAGNOSIS — E079 Disorder of thyroid, unspecified: Secondary | ICD-10-CM

## 2013-10-18 DIAGNOSIS — E0789 Other specified disorders of thyroid: Secondary | ICD-10-CM

## 2013-10-19 ENCOUNTER — Telehealth: Payer: Self-pay | Admitting: Neurology

## 2013-10-19 NOTE — Telephone Encounter (Signed)
Left message for patient to call and schedule nerve conduction and EMG test.

## 2013-10-20 ENCOUNTER — Other Ambulatory Visit: Payer: BC Managed Care – PPO

## 2013-10-21 ENCOUNTER — Ambulatory Visit
Admission: RE | Admit: 2013-10-21 | Discharge: 2013-10-21 | Disposition: A | Discharge status: Home / Self Care | Payer: BC Managed Care – PPO | Source: Ambulatory Visit | Attending: Family Medicine | Admitting: Family Medicine

## 2013-10-21 DIAGNOSIS — E0789 Other specified disorders of thyroid: Secondary | ICD-10-CM

## 2013-10-21 DIAGNOSIS — E079 Disorder of thyroid, unspecified: Secondary | ICD-10-CM

## 2013-10-25 ENCOUNTER — Other Ambulatory Visit: Payer: Self-pay | Admitting: Family Medicine

## 2013-10-25 DIAGNOSIS — E041 Nontoxic single thyroid nodule: Secondary | ICD-10-CM

## 2013-10-26 ENCOUNTER — Other Ambulatory Visit: Payer: Self-pay | Admitting: Family Medicine

## 2013-10-26 DIAGNOSIS — E041 Nontoxic single thyroid nodule: Secondary | ICD-10-CM

## 2013-10-31 ENCOUNTER — Ambulatory Visit (HOSPITAL_COMMUNITY): Payer: BC Managed Care – PPO | Attending: Cardiology | Admitting: Radiology

## 2013-10-31 ENCOUNTER — Encounter: Payer: Self-pay | Admitting: Cardiology

## 2013-10-31 DIAGNOSIS — G90A Postural orthostatic tachycardia syndrome (POTS): Secondary | ICD-10-CM

## 2013-10-31 DIAGNOSIS — Q248 Other specified congenital malformations of heart: Secondary | ICD-10-CM | POA: Insufficient documentation

## 2013-10-31 DIAGNOSIS — R000 Tachycardia, unspecified: Secondary | ICD-10-CM | POA: Insufficient documentation

## 2013-10-31 DIAGNOSIS — I493 Ventricular premature depolarization: Secondary | ICD-10-CM

## 2013-10-31 DIAGNOSIS — I4949 Other premature depolarization: Secondary | ICD-10-CM | POA: Insufficient documentation

## 2013-10-31 DIAGNOSIS — R42 Dizziness and giddiness: Secondary | ICD-10-CM | POA: Insufficient documentation

## 2013-10-31 DIAGNOSIS — Z8673 Personal history of transient ischemic attack (TIA), and cerebral infarction without residual deficits: Secondary | ICD-10-CM | POA: Insufficient documentation

## 2013-10-31 DIAGNOSIS — I498 Other specified cardiac arrhythmias: Secondary | ICD-10-CM

## 2013-10-31 DIAGNOSIS — I951 Orthostatic hypotension: Secondary | ICD-10-CM

## 2013-10-31 NOTE — Progress Notes (Signed)
Echocardiogram performed.  

## 2013-11-08 ENCOUNTER — Ambulatory Visit
Admission: RE | Admit: 2013-11-08 | Discharge: 2013-11-08 | Disposition: A | Discharge status: Home / Self Care | Payer: BC Managed Care – PPO | Source: Ambulatory Visit | Attending: Family Medicine | Admitting: Family Medicine

## 2013-11-08 ENCOUNTER — Other Ambulatory Visit (HOSPITAL_COMMUNITY)
Admission: RE | Admit: 2013-11-08 | Discharge: 2013-11-08 | Disposition: A | Discharge status: Home / Self Care | Payer: BC Managed Care – PPO | Source: Ambulatory Visit | Attending: Interventional Radiology | Admitting: Interventional Radiology

## 2013-11-08 DIAGNOSIS — E049 Nontoxic goiter, unspecified: Secondary | ICD-10-CM | POA: Insufficient documentation

## 2013-11-08 DIAGNOSIS — E041 Nontoxic single thyroid nodule: Secondary | ICD-10-CM

## 2013-11-18 ENCOUNTER — Ambulatory Visit (INDEPENDENT_AMBULATORY_CARE_PROVIDER_SITE_OTHER): Payer: BC Managed Care – PPO | Admitting: Cardiology

## 2013-11-18 ENCOUNTER — Encounter: Payer: Self-pay | Admitting: Cardiology

## 2013-11-18 VITALS — BP 124/74 | HR 69 | Ht 63.0 in | Wt 131.8 lb

## 2013-11-18 DIAGNOSIS — G90A Postural orthostatic tachycardia syndrome (POTS): Secondary | ICD-10-CM

## 2013-11-18 DIAGNOSIS — I4949 Other premature depolarization: Secondary | ICD-10-CM

## 2013-11-18 DIAGNOSIS — R42 Dizziness and giddiness: Secondary | ICD-10-CM

## 2013-11-18 DIAGNOSIS — I493 Ventricular premature depolarization: Secondary | ICD-10-CM

## 2013-11-18 DIAGNOSIS — G459 Transient cerebral ischemic attack, unspecified: Secondary | ICD-10-CM

## 2013-11-18 DIAGNOSIS — R000 Tachycardia, unspecified: Secondary | ICD-10-CM

## 2013-11-18 DIAGNOSIS — I951 Orthostatic hypotension: Secondary | ICD-10-CM

## 2013-11-18 DIAGNOSIS — I498 Other specified cardiac arrhythmias: Secondary | ICD-10-CM

## 2013-11-18 MED ORDER — METOPROLOL TARTRATE 25 MG PO TABS: ORAL_TABLET | ORAL | Status: DC

## 2013-11-18 NOTE — Patient Instructions (Signed)
Your physician has recommended you make the following change in your medication:   1. Increase Metoprolol 25 mg to 1 and 1/2 tablet twice a day.  Your physician wants you to follow-up in: 6 months with Dr. Aundra Dubin. You will receive a reminder letter in the mail two months in advance. If you don't receive a letter, please call our office to schedule the follow-up appointment.

## 2013-11-20 NOTE — Progress Notes (Signed)
Patient ID: Michele [name], female   DOB: Jul 21, 1951, 63 y.o.   MRN: [id number] PCP: Dr. 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  For months, she has had spells of profuse sweating.  She has also felt frequent palpitations.  Holter monitor showed very frequent (11.6% of total) PVCs.  Echo in 2/15 showed normal EF with no significant abnormalities.  Main complaint has been lightheadedness with ambulation.  She will note lightheadedness when she stands up and walks across a room.  She will feel her heart pounding.  Sometimes her vision will "go black" and she will have to lean against a wall.  She has not passed out.  At last visit, her BP did not drop with standing but she developed significant tachycardia.  I was concerned that she has POTS.    At 63 appointment, I started her on Florinef and asked her to wear compression stockings.  She has had significant improvement though she still has some lightheadedness with standing. No further presycnopal episodes.  Palpitations are still present but less prominent.    Labs (12/14): K 4.3, creatinine 0.82, TSH normal, free T4 normal  PMH: 1. Suspected POTS. 2. Depression 3. TAH/BSO 4. OA spine 5. ? TIA in 2003.  6. Stress test 2-3 years ago normal per patient's report.  7. PVCs: Holter (12/14) with frequent PVCs, 11.6% of total beats.  Echo (2/15) with EF 65-70%, no significant abnormalities.   FH: Grandmother with PCM, grandfather with MI and CVA.  SH: Quit smoking in 7/03, rare ETOH, widow with 2 children, lives in [place], unemployed.   ROS: All systems reviewed and negative except as per HPI.   Current Outpatient Prescriptions  Medication Sig Dispense Refill  . ALPRAZolam (XANAX) 0.5 MG tablet Take 1 tablet by mouth as needed.      Marland Kitchen aspirin EC 81 MG tablet Take 1 tablet (81 mg total) by mouth daily.      . fludrocortisone (FLORINEF) 0.1 MG tablet Take  1 tablet (0.1 mg total) by mouth daily.  30 tablet  3  . HYDROcodone-acetaminophen (NORCO) 10-325 MG per tablet Take 1 tablet by mouth every 6 (six) hours as needed.      . metoprolol tartrate (LOPRESSOR) 25 MG tablet 1 and 1/2 tablet twice a day  90 tablet  6  . topiramate (TOPAMAX) 25 MG tablet Take 1 tablet (25 mg total) by mouth 2 (two) times daily.  120 tablet  3   No current facility-administered medications for this visit.    BP 124/74  Pulse 69  Ht 5\' 3"  (1.6 m)  Wt 59.784 kg (131 lb 12.8 oz)  BMI 23.35 kg/m2 General: NAD Neck: No JVD, no thyromegaly or thyroid nodule.  Lungs: Clear to auscultation bilaterally with normal respiratory effort. CV: Nondisplaced PMI.  Heart regular S1/S2, no S3/S4, no murmur.  No peripheral edema.  No carotid bruit.  Normal pedal pulses.  Abdomen: Soft, nontender, no hepatosplenomegaly, no distention.  Skin: Intact without lesions or rashes.  Neurologic: Alert and oriented x 3.  Psych: Normal affect. Extremities: No clubbing or cyanosis.  HEENT: Normal.   Assessment/Plan: 1. 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  Her symptoms fit.  Her HR increased excessively with standing at last appointment (BP remained stable).  She has had significant improvement with Florinef and compression stockings.   -Continue Florinef and compression stockings  during the day. 2. PVCs: Very frequent PVCs on holter (11.6%).  Frequent PVCs can cause a cardiomyopathy, usually occurs with > 15% PVCs.  She remains symptomatic with palpitations.  Echo showed normal EF.  - Increase metoprolol to 37.5 bid.  3. ?TIA: Given possible prior TIA, she is on ASA 81 mg daily.   Loralie Champagne 11/20/2013

## 2013-12-02 ENCOUNTER — Encounter (HOSPITAL_COMMUNITY): Payer: Self-pay | Admitting: Pharmacy Technician

## 2013-12-07 ENCOUNTER — Other Ambulatory Visit: Payer: Self-pay | Admitting: Otolaryngology

## 2013-12-12 ENCOUNTER — Ambulatory Visit (HOSPITAL_COMMUNITY)
Admission: RE | Admit: 2013-12-12 | Discharge: 2013-12-12 | Disposition: A | Discharge status: Home / Self Care | Payer: BC Managed Care – PPO | Source: Ambulatory Visit | Attending: Anesthesiology | Admitting: Anesthesiology

## 2013-12-12 ENCOUNTER — Encounter (HOSPITAL_COMMUNITY): Payer: Self-pay

## 2013-12-12 ENCOUNTER — Encounter (HOSPITAL_COMMUNITY)
Admission: RE | Admit: 2013-12-12 | Discharge: 2013-12-12 | Disposition: A | Discharge status: Home / Self Care | Payer: BC Managed Care – PPO | Source: Ambulatory Visit | Attending: Otolaryngology | Admitting: Otolaryngology

## 2013-12-12 DIAGNOSIS — Z01812 Encounter for preprocedural laboratory examination: Secondary | ICD-10-CM | POA: Insufficient documentation

## 2013-12-12 HISTORY — DX: Personal history of urinary calculi: Z87.442

## 2013-12-12 HISTORY — DX: Pneumonia, unspecified organism: J18.9

## 2013-12-12 HISTORY — DX: Cardiac arrhythmia, unspecified: I49.9

## 2013-12-12 HISTORY — DX: Shortness of breath: R06.02

## 2013-12-12 HISTORY — DX: Cerebral infarction, unspecified: I63.9

## 2013-12-12 HISTORY — DX: Nausea with vomiting, unspecified: Z98.890

## 2013-12-12 HISTORY — DX: Respiratory tuberculosis unspecified: A15.9

## 2013-12-12 HISTORY — DX: Peripheral vascular disease, unspecified: I73.9

## 2013-12-12 HISTORY — DX: Disorder of adrenal gland, unspecified: E27.9

## 2013-12-12 HISTORY — DX: Other specified postprocedural states: Z98.890

## 2013-12-12 HISTORY — DX: Nontoxic multinodular goiter: E04.2

## 2013-12-12 HISTORY — DX: Nausea with vomiting, unspecified: R11.2

## 2013-12-12 LAB — BASIC METABOLIC PANEL
BUN: 13 mg/dL (ref 6–23)
CHLORIDE: 105 meq/L (ref 96–112)
CO2: 24 mEq/L (ref 19–32)
Calcium: 9.2 mg/dL (ref 8.4–10.5)
Creatinine, Ser: 0.67 mg/dL (ref 0.50–1.10)
GFR calc Af Amer: >90 mL/min (ref >90)
GLUCOSE: 113 mg/dL — AB (ref 70–99)
Potassium: 4 mEq/L (ref 3.7–5.3)
SODIUM: 141 meq/L (ref 137–147)

## 2013-12-12 LAB — CBC
HEMATOCRIT: 39.7 % (ref 36.0–46.0)
HEMOGLOBIN: 13.5 g/dL (ref 12.0–15.0)
MCH: 32.6 pg (ref 26.0–34.0)
MCHC: 34 g/dL (ref 30.0–36.0)
MCV: 95.9 fL (ref 78.0–100.0)
Platelets: 269 10*3/uL (ref 150–400)
RBC: 4.14 MIL/uL (ref 3.87–5.11)
RDW: 12.8 % (ref 11.5–15.5)
WBC: 7.5 10*3/uL (ref 4.0–10.5)

## 2013-12-12 NOTE — Pre-Procedure Instructions (Addendum)
ODELL FASCHING  12/12/2013   Your procedure is scheduled on:  12/16/13  Report to [facility] short stay admitting at 700 AM.  Call this number if you have problems the morning of surgery: [phone]   Remember:   Do not eat food or drink liquids after midnight.   Take these medicines the morning of surgery with A SIP OF WATER: xanax,pain med if needed ,topiramate metoprolol        STOP all herbel meds, nsaids (aleve,naproxen,advil,ibuprofen) today including vitamins, aspirin   Do not wear jewelry, make-up or nail polish.  Do not wear lotions, powders, or perfumes. You may wear deodorant.  Do not shave 48 hours prior to surgery. Men may shave face and neck.  Do not bring valuables to the hospital.  [facility] is not responsible                  for any belongings or valuables.               Contacts, dentures or bridgework may not be worn into surgery.  Leave suitcase in the car. After surgery it may be brought to your room.  For patients admitted to the hospital, discharge time is determined by your                treatment team.               Patients discharged the day of surgery will not be allowed to drive  home.  Name and phone number of your driver:   Special Instructions:  Special Instructions: [facility] - Preparing for Surgery  Before surgery, you can play an important role.  Because skin is not sterile, your skin needs to be as free of germs as possible.  You can reduce the number of germs on you skin by washing with CHG (chlorahexidine gluconate) soap before surgery.  CHG is an antiseptic cleaner which kills germs and bonds with the skin to continue killing germs even after washing.  Please DO NOT use if you have an allergy to CHG or antibacterial soaps.  If your skin becomes reddened/irritated stop using the CHG and inform your nurse when you arrive at Short Stay.  Do not shave (including legs and underarms) for at least 48 hours prior to the first CHG shower.  You may  shave your face.  Please follow these instructions carefully:   1.  Shower with CHG Soap the night before surgery and the morning of Surgery.  2.  If you choose to wash your hair, wash your hair first as usual with your normal shampoo.  3.  After you shampoo, rinse your hair and body thoroughly to remove the Shampoo.  4.  Use CHG as you would any other liquid soap.  You can apply chg directly  to the skin and wash gently with scrungie or a clean washcloth.  5.  Apply the CHG Soap to your body ONLY FROM THE NECK DOWN.  Do not use on open wounds or open sores.  Avoid contact with your eyes ears, mouth and genitals (private parts).  Wash genitals (private parts)       with your normal soap.  6.  Wash thoroughly, paying special attention to the area where your surgery will be performed.  7.  Thoroughly rinse your body with warm water from the neck down.  8.  DO NOT shower/wash with your normal soap after using and rinsing off the CHG Soap.  9.  Pat yourself dry with a clean towel.            10.  Wear clean pajamas.            11.  Place clean sheets on your bed the night of your first shower and do not sleep with pets.  Day of Surgery  Do not apply any lotions/deodorants the morning of surgery.  Please wear clean clothes to the hospital/surgery center.   Please read over the following fact sheets that you were given: Pain Booklet, Coughing and Deep Breathing and Surgical Site Infection Prevention

## 2013-12-12 NOTE — Addendum Note (Signed)
Addended by: Jerrell Belfast on: 12/12/2013 09:53 AM   Modules accepted: Orders

## 2013-12-14 NOTE — Progress Notes (Signed)
Anesthesia chart review: Patient is a 63 year old female scheduled for right thyroidectomy on 12/16/2013 by Dr. Wilburn Cornelia. FNA on 11/08/13 could not rule out follicular neoplasm.   History includes former smoker, orthostatic hypotension with tachycardia felt to likely have postural orthostatic tachycardia syndrome (POTS) started on Florinef 09/2013 by cardiologist Dr. Aundra Dubin, PVCs on metoprolol, postoperative nausea and vomiting, interstitial cystitis, anxiety, depression, endometriosis, nephrolithiasis, chronic dyspnea on exertion, prior pneumonia, history of positive PPD, TIA in 2005, left arm phlebitis 1978, tachycardia, endometriosis, spinal stenosis, lumbar laminectomy, hysterectomy, T&A, nasal sinus surgery. PCP is listed as Dr. Shirline Frees.  EKG on 10/11/13 showed NSR.  Echo on 10/31/13 showed: Left ventricle: The cavity size was normal. Systolic function was vigorous. The estimated ejection fraction was in the range of 65% to 70%. Wall motion was normal; there were no regional wall motion abnormalities. There was an increased relative contribution of atrial contraction to ventricular filling. Doppler parameters are consistent with abnormal left ventricular relaxation (grade 1 diastolic dysfunction).   Holter monitor on 08/2014 showed SB to ST, frequent PVCs, no ventricular runs.  CXR on 12/12/13 showed no active cardiopulmonary disease.  Preoperative labs noted. She had a normal TSH and free T4 in 08/2013 (see Dr. Lynnda Child notes scanned under Media tab).  Patient with recent cardiology follow-up last month.  POTS symptoms improved on Florinef and compression stocking.  She is on metoprolol for PVCs which have been asymptomatic.  EF was normal last month.  If no acute changes then I am anticipating that she could proceed as planned.  George Hugh [facility] Short Stay Center/Anesthesiology Phone [phone] 12/14/2013 10:28 AM

## 2013-12-15 MED ORDER — CEFAZOLIN SODIUM-DEXTROSE 2-3 GM-% IV SOLR
2.0000 g | INTRAVENOUS | Status: AC
  Administered 2013-12-16: 2 g via INTRAVENOUS
  Filled 2013-12-15: qty 50

## 2013-12-15 MED ORDER — DEXAMETHASONE SODIUM PHOSPHATE 10 MG/ML IJ SOLN: 10.0000 mg | Freq: Once | INTRAMUSCULAR | Status: DC

## 2013-12-16 ENCOUNTER — Ambulatory Visit (HOSPITAL_COMMUNITY): Payer: BC Managed Care – PPO | Admitting: Anesthesiology

## 2013-12-16 ENCOUNTER — Encounter (HOSPITAL_COMMUNITY): Payer: BC Managed Care – PPO | Admitting: Vascular Surgery

## 2013-12-16 ENCOUNTER — Observation Stay (HOSPITAL_COMMUNITY)
Admission: RE | Admit: 2013-12-16 | Discharge: 2013-12-17 | Disposition: A | Discharge status: Home / Self Care | Payer: BC Managed Care – PPO | Source: Ambulatory Visit | Attending: Otolaryngology | Admitting: Otolaryngology

## 2013-12-16 ENCOUNTER — Encounter (HOSPITAL_COMMUNITY): Payer: Self-pay | Admitting: *Deleted

## 2013-12-16 ENCOUNTER — Encounter (HOSPITAL_COMMUNITY)
Admission: RE | Disposition: A | Discharge status: Home / Self Care | Payer: Self-pay | Source: Ambulatory Visit | Attending: Otolaryngology

## 2013-12-16 DIAGNOSIS — Z7982 Long term (current) use of aspirin: Secondary | ICD-10-CM | POA: Insufficient documentation

## 2013-12-16 DIAGNOSIS — F3289 Other specified depressive episodes: Secondary | ICD-10-CM | POA: Insufficient documentation

## 2013-12-16 DIAGNOSIS — Z87891 Personal history of nicotine dependence: Secondary | ICD-10-CM | POA: Insufficient documentation

## 2013-12-16 DIAGNOSIS — E041 Nontoxic single thyroid nodule: Secondary | ICD-10-CM

## 2013-12-16 DIAGNOSIS — F411 Generalized anxiety disorder: Secondary | ICD-10-CM | POA: Insufficient documentation

## 2013-12-16 DIAGNOSIS — E063 Autoimmune thyroiditis: Principal | ICD-10-CM | POA: Insufficient documentation

## 2013-12-16 DIAGNOSIS — Z8673 Personal history of transient ischemic attack (TIA), and cerebral infarction without residual deficits: Secondary | ICD-10-CM | POA: Insufficient documentation

## 2013-12-16 DIAGNOSIS — F329 Major depressive disorder, single episode, unspecified: Secondary | ICD-10-CM | POA: Insufficient documentation

## 2013-12-16 DIAGNOSIS — I739 Peripheral vascular disease, unspecified: Secondary | ICD-10-CM | POA: Insufficient documentation

## 2013-12-16 DIAGNOSIS — D497 Neoplasm of unspecified behavior of endocrine glands and other parts of nervous system: Secondary | ICD-10-CM | POA: Diagnosis present

## 2013-12-16 HISTORY — PX: THYROIDECTOMY: SHX17

## 2013-12-16 HISTORY — DX: Phlebitis and thrombophlebitis of unspecified site: I80.9

## 2013-12-16 SURGERY — THYROIDECTOMY
Anesthesia: General | Site: Neck | Laterality: Right

## 2013-12-16 MED ORDER — MORPHINE SULFATE 2 MG/ML IJ SOLN: 2.0000 mg | INTRAMUSCULAR | Status: DC | PRN

## 2013-12-16 MED ORDER — ALPRAZOLAM 0.5 MG PO TABS: 0.5000 mg | ORAL_TABLET | Freq: Every day | ORAL | Status: DC | PRN

## 2013-12-16 MED ORDER — HYDROMORPHONE HCL PF 1 MG/ML IJ SOLN
INTRAMUSCULAR | Status: AC
  Filled 2013-12-16: qty 1

## 2013-12-16 MED ORDER — ONDANSETRON HCL 4 MG/2ML IJ SOLN
INTRAMUSCULAR | Status: AC
  Filled 2013-12-16: qty 2

## 2013-12-16 MED ORDER — ONDANSETRON HCL 4 MG/2ML IJ SOLN: 4.0000 mg | INTRAMUSCULAR | Status: DC | PRN

## 2013-12-16 MED ORDER — FENTANYL CITRATE 0.05 MG/ML IJ SOLN
INTRAMUSCULAR | Status: AC
  Filled 2013-12-16: qty 5

## 2013-12-16 MED ORDER — LACTATED RINGERS IV SOLN
INTRAVENOUS | Status: DC | PRN
  Administered 2013-12-16 (×2): via INTRAVENOUS

## 2013-12-16 MED ORDER — SUCCINYLCHOLINE CHLORIDE 20 MG/ML IJ SOLN
INTRAMUSCULAR | Status: AC
  Filled 2013-12-16: qty 1

## 2013-12-16 MED ORDER — OXYCODONE HCL 5 MG/5ML PO SOLN
ORAL | Status: AC
  Filled 2013-12-16: qty 5

## 2013-12-16 MED ORDER — MIDAZOLAM HCL 5 MG/5ML IJ SOLN
INTRAMUSCULAR | Status: DC | PRN
  Administered 2013-12-16: 2 mg via INTRAVENOUS

## 2013-12-16 MED ORDER — OXYCODONE HCL 5 MG/5ML PO SOLN
5.0000 mg | Freq: Once | ORAL | Status: DC | PRN
  Administered 2013-12-16: 5 mg via ORAL

## 2013-12-16 MED ORDER — FLUDROCORTISONE ACETATE 0.1 MG PO TABS
0.1000 mg | ORAL_TABLET | Freq: Every day | ORAL | Status: DC
  Administered 2013-12-17: 0.1 mg via ORAL
  Filled 2013-12-16 (×2): qty 1

## 2013-12-16 MED ORDER — LACTATED RINGERS IV SOLN: INTRAVENOUS | Status: DC

## 2013-12-16 MED ORDER — OXYCODONE HCL 5 MG PO TABS: 5.0000 mg | ORAL_TABLET | Freq: Once | ORAL | Status: DC | PRN

## 2013-12-16 MED ORDER — BACITRACIN ZINC 500 UNIT/GM EX OINT
TOPICAL_OINTMENT | CUTANEOUS | Status: AC
  Filled 2013-12-16: qty 15

## 2013-12-16 MED ORDER — HYDROCODONE-ACETAMINOPHEN 5-325 MG PO TABS: 1.0000 | ORAL_TABLET | ORAL | Status: DC | PRN

## 2013-12-16 MED ORDER — MIDAZOLAM HCL 2 MG/2ML IJ SOLN
INTRAMUSCULAR | Status: AC
  Filled 2013-12-16: qty 2

## 2013-12-16 MED ORDER — SUCCINYLCHOLINE CHLORIDE 20 MG/ML IJ SOLN
INTRAMUSCULAR | Status: DC | PRN
  Administered 2013-12-16: 100 mg via INTRAVENOUS

## 2013-12-16 MED ORDER — PROMETHAZINE HCL 25 MG/ML IJ SOLN: 6.2500 mg | INTRAMUSCULAR | Status: DC | PRN

## 2013-12-16 MED ORDER — PROPOFOL 10 MG/ML IV BOLUS
INTRAVENOUS | Status: AC
  Filled 2013-12-16: qty 20

## 2013-12-16 MED ORDER — LIDOCAINE HCL (CARDIAC) 20 MG/ML IV SOLN
INTRAVENOUS | Status: AC
  Filled 2013-12-16: qty 5

## 2013-12-16 MED ORDER — LIDOCAINE HCL (CARDIAC) 20 MG/ML IV SOLN
INTRAVENOUS | Status: DC | PRN
  Administered 2013-12-16: 60 mg via INTRAVENOUS

## 2013-12-16 MED ORDER — DEXTROSE-NACL 5-0.45 % IV SOLN
INTRAVENOUS | Status: DC
  Administered 2013-12-16: 13:00:00 via INTRAVENOUS

## 2013-12-16 MED ORDER — 0.9 % SODIUM CHLORIDE (POUR BTL) OPTIME
TOPICAL | Status: DC | PRN
  Administered 2013-12-16: 1000 mL

## 2013-12-16 MED ORDER — ONDANSETRON HCL 4 MG/2ML IJ SOLN
INTRAMUSCULAR | Status: DC | PRN
  Administered 2013-12-16: 4 mg via INTRAVENOUS

## 2013-12-16 MED ORDER — PROPOFOL 10 MG/ML IV BOLUS
INTRAVENOUS | Status: DC | PRN
  Administered 2013-12-16: 160 mg via INTRAVENOUS

## 2013-12-16 MED ORDER — LIDOCAINE-EPINEPHRINE 1 %-1:100000 IJ SOLN
INTRAMUSCULAR | Status: AC
  Filled 2013-12-16: qty 1

## 2013-12-16 MED ORDER — BACITRACIN ZINC 500 UNIT/GM EX OINT: TOPICAL_OINTMENT | CUTANEOUS | Status: DC | PRN

## 2013-12-16 MED ORDER — TOPIRAMATE 25 MG PO TABS
25.0000 mg | ORAL_TABLET | Freq: Two times a day (BID) | ORAL | Status: DC
  Administered 2013-12-16 – 2013-12-17 (×2): 25 mg via ORAL
  Filled 2013-12-16 (×3): qty 1

## 2013-12-16 MED ORDER — HYDROMORPHONE HCL PF 1 MG/ML IJ SOLN
0.2500 mg | INTRAMUSCULAR | Status: DC | PRN
  Administered 2013-12-16: 0.5 mg via INTRAVENOUS

## 2013-12-16 MED ORDER — ONDANSETRON HCL 4 MG PO TABS: 4.0000 mg | ORAL_TABLET | ORAL | Status: DC | PRN

## 2013-12-16 MED ORDER — FENTANYL CITRATE 0.05 MG/ML IJ SOLN
INTRAMUSCULAR | Status: DC | PRN
  Administered 2013-12-16: 50 ug via INTRAVENOUS
  Administered 2013-12-16: 100 ug via INTRAVENOUS
  Administered 2013-12-16 (×2): 50 ug via INTRAVENOUS
  Administered 2013-12-16: 100 ug via INTRAVENOUS

## 2013-12-16 MED ORDER — HEPARIN SODIUM (PORCINE) 5000 UNIT/ML IJ SOLN
5000.0000 [IU] | Freq: Three times a day (TID) | INTRAMUSCULAR | Status: DC
  Administered 2013-12-17: 5000 [IU] via SUBCUTANEOUS
  Filled 2013-12-16 (×4): qty 1

## 2013-12-16 MED ORDER — LIDOCAINE-EPINEPHRINE 1 %-1:100000 IJ SOLN: INTRAMUSCULAR | Status: DC | PRN

## 2013-12-16 MED ORDER — METOPROLOL TARTRATE 25 MG PO TABS
37.5000 mg | ORAL_TABLET | Freq: Two times a day (BID) | ORAL | Status: DC
  Administered 2013-12-16 – 2013-12-17 (×2): 37.5 mg via ORAL
  Filled 2013-12-16 (×4): qty 1

## 2013-12-16 SURGICAL SUPPLY — 53 items
APPLIER CLIP 9.375 SM OPEN (CLIP)
ATTRACTOMAT 16X20 MAGNETIC DRP (DRAPES) IMPLANT
CANISTER SUCTION 2500CC (MISCELLANEOUS) ×3 IMPLANT
CLEANER TIP ELECTROSURG 2X2 (MISCELLANEOUS) ×3 IMPLANT
CLIP APPLIE 9.375 SM OPEN (CLIP) IMPLANT
CONT SPEC 4OZ CLIKSEAL STRL BL (MISCELLANEOUS) ×3 IMPLANT
CORDS BIPOLAR (ELECTRODE) ×3 IMPLANT
COVER SURGICAL LIGHT HANDLE (MISCELLANEOUS) ×3 IMPLANT
DERMABOND ADVANCED (GAUZE/BANDAGES/DRESSINGS) ×2
DERMABOND ADVANCED .7 DNX12 (GAUZE/BANDAGES/DRESSINGS) ×1 IMPLANT
DRAIN SNY 10 ROU (WOUND CARE) ×3 IMPLANT
DRAPE PROXIMA HALF (DRAPES) ×3 IMPLANT
ELECT COATED BLADE 2.86 ST (ELECTRODE) ×3 IMPLANT
ELECT REM PT RETURN 9FT ADLT (ELECTROSURGICAL) ×3
ELECTRODE REM PT RTRN 9FT ADLT (ELECTROSURGICAL) ×1 IMPLANT
EVACUATOR SILICONE 100CC (DRAIN) ×3 IMPLANT
GAUZE SPONGE 4X4 16PLY XRAY LF (GAUZE/BANDAGES/DRESSINGS) ×3 IMPLANT
GLOVE BIOGEL M 7.0 STRL (GLOVE) ×3 IMPLANT
GLOVE BIOGEL PI IND STRL 7.5 (GLOVE) ×1 IMPLANT
GLOVE BIOGEL PI INDICATOR 7.5 (GLOVE) ×2
GLOVE SURG SS PI 6.0 STRL IVOR (GLOVE) IMPLANT
GLOVE SURG SS PI 6.5 STRL IVOR (GLOVE) ×6 IMPLANT
GLOVE SURG SS PI 7.0 STRL IVOR (GLOVE) ×3 IMPLANT
GLOVE SURG SS PI 7.5 STRL IVOR (GLOVE) ×6 IMPLANT
GOWN STRL REUS W/ TWL LRG LVL3 (GOWN DISPOSABLE) ×2 IMPLANT
GOWN STRL REUS W/ TWL XL LVL3 (GOWN DISPOSABLE) ×2 IMPLANT
GOWN STRL REUS W/TWL LRG LVL3 (GOWN DISPOSABLE) ×4
GOWN STRL REUS W/TWL XL LVL3 (GOWN DISPOSABLE) ×4
KIT BASIN OR (CUSTOM PROCEDURE TRAY) ×3 IMPLANT
KIT ROOM TURNOVER OR (KITS) ×3 IMPLANT
LIDOCAINE 1% WITH 1:100,000 EPINEPHRINE IMPLANT
MARKER SKIN DUAL TIP RULER LAB (MISCELLANEOUS) ×3 IMPLANT
NS IRRIG 1000ML POUR BTL (IV SOLUTION) ×3 IMPLANT
PAD ARMBOARD 7.5X6 YLW CONV (MISCELLANEOUS) ×6 IMPLANT
PENCIL BUTTON HOLSTER BLD 10FT (ELECTRODE) ×3 IMPLANT
SHEARS HARMONIC 9CM CVD (BLADE) ×3 IMPLANT
SPONGE INTESTINAL PEANUT (DISPOSABLE) ×3 IMPLANT
STAPLER VISISTAT 35W (STAPLE) ×3 IMPLANT
SUT CHROMIC 2 0 SH (SUTURE) IMPLANT
SUT ETHILON 3 0 PS 1 (SUTURE) ×3 IMPLANT
SUT ETHILON 5 0 PS 2 18 (SUTURE) ×3 IMPLANT
SUT SILK 2 0 REEL (SUTURE) IMPLANT
SUT SILK 2 0 SH CR/8 (SUTURE) ×3 IMPLANT
SUT SILK 3 0 REEL (SUTURE) ×3 IMPLANT
SUT SILK 4 0 REEL (SUTURE) IMPLANT
SUT VIC AB 3-0 SH 27 (SUTURE) ×2
SUT VIC AB 3-0 SH 27XBRD (SUTURE) ×1 IMPLANT
SUT VICRYL 4-0 PS2 18IN ABS (SUTURE) ×3 IMPLANT
TOWEL OR 17X24 6PK STRL BLUE (TOWEL DISPOSABLE) ×3 IMPLANT
TOWEL OR 17X26 10 PK STRL BLUE (TOWEL DISPOSABLE) ×3 IMPLANT
TRAY ENT MC OR (CUSTOM PROCEDURE TRAY) ×3 IMPLANT
WATER STERILE IRR 1000ML POUR (IV SOLUTION) ×3 IMPLANT
ZIMMER HEMOVAC WOUND DRAINAGE DEVICE ×3 IMPLANT

## 2013-12-16 NOTE — Op Note (Signed)
[name], [name]                ACCOUNT NO.:  [id number]  MEDICAL RECORD NO.:  [id number]  LOCATION:                               FACILITY:  [facility]  PHYSICIAN:  Michele [name] [name], M.D.DATE OF BIRTH:  September 23, 1950  DATE OF PROCEDURE:  12/16/2013 DATE OF DISCHARGE:  12/12/2013                              OPERATIVE REPORT   PREOPERATIVE DIAGNOSIS:  Right thyroid lobe atypical mass.  POSTOPERATIVE DIAGNOSIS:  Right thyroid lobe atypical mass.  INDICATION FOR SURGERY:  Right thyroid lobe atypical mass.  SURGICAL PROCEDURE:  Right thyroidectomy.  SURGEON:  Michele Chars. Wilburn Cornelia, MD.  ASSISTANT:  Michele [name] [name], [facility]  ANESTHESIA:  General endotracheal.  ESTIMATED BLOOD LOSS:  Minimal.  COMPLICATIONS:  None.  DISPOSITION:  The patient transferred from the operating room to the recovery room in stable condition.  BRIEF HISTORY:  The patient is a 63 year old white female, referred to our office for evaluation of a right thyroid mass which on ultrasound- guided fine needle biopsy showed atypical cells, unable to rule out thyroid carcinoma.  Given the patient's history and findings, we discussed treatment options including observation and rebiopsy versus right hemithyroidectomy under general anesthesia.  The patient was concerned about the possibility of malignancy and elected to proceed with surgical intervention.  The risks and benefits of right hemithyroidectomy were discussed in detail.  The patient understood and concurred with our plan for surgery which is scheduled at [facility] under general anesthesia with anticipated overnight observation.  DESCRIPTION OF PROCEDURE:  The patient was brought to the operating room on December 16, 2013, and placed in supine position on the operating table. General endotracheal anesthesia was established without difficulty. When the patient was adequately anesthetized, she was positioned on the operating table, prepped and draped in  sterile fashion.  A 5 cm horizontally oriented skin incision was created in a preexisting skin crease and carried through the skin underlying subcutaneous tissue to the level of the platysmal fascia.  The anterior jugular veins were identified, divided, and suture ligated, and a subplatysmal flap was elevated superiorly and inferiorly, allowing direct access to the anterior compartment of the neck.  The trachea and anterior aspect of the thyroid gland were visualized.  The strap muscles were divided in the midline and lateralized, and is allowed access to the peritracheal region.  Palpation of the left thyroid lobe revealed no palpable nodule or mass.  Attention was then turned to the right side where a right hemithyroidectomy was performed.  The capsule of the gland was identified and strap muscles were lateralized.  Careful dissection along the capsule was undertaken.  The middle thyroid vein was divided and suture ligated.  The superior thyroid pedicle was carefully dissected and divided and ligated with a 2-0 silk suture ties.  The gland was then reflected from lateral to medial.  The inferior component of the gland was dissected and the inferior thyroid artery and vein were divided and ligated with the Harmonic Scalpel.  The thyroid isthmus was then carefully dissected.  There was a small pyramidal lobe adjacent to the isthmus and this was resected as part of the surgical specimen.  The isthmus was dissected free  of the trachea and divided with Bovie electrocautery.  The gland was then rotated further anteriorly and Barry's ligament was dissected with electrocautery and blunt and sharp dissection, removing the entire right thyroid lobe.  The right recurrent laryngeal nerve was left within the right tracheoesophageal groove and was not further explored.  The surgical specimen was removed and sent to Pathology for frozen section analysis.  The previously biopsied nodule was consistent  with follicular cells on frozen section.  No further pathologic determination was made and final diagnosis deferred to permanent pathology.  The procedure was then concluded.  The left thyroid lobe was not removed.  The patient's surgical wound was then thoroughly irrigated with saline.  Small areas of point hemorrhage were cauterized with bipolar cautery.  A 7-French round drain was then placed at the depth of the incision and sutured with a 5-0 Ethilon sutured to the skin.  The incision was then closed in multiple layers beginning with reapproximation of midline strap muscles with interrupted 3-0 Vicryl suture.  Deep subcutaneous tissue closed with 3-0 Vicryl suture. Superficial subcutaneous layer closed with interrupted 4-0 Vicryl and interrupted subcuticular closure with horizontal mattress sutures of 4-0 Vicryl.  Final skin edge closed with Dermabond surgical glue.  The patient was awakened from anesthetic, extubated, and was transferred from the operating room to recovery room in stable condition.  No complications and estimated blood loss was minimal.          ______________________________ Michele [name] [name], M.D.     DLS/MEDQ  D:  84/53/[id number]  T:  12/16/2013  Job:  [id number]

## 2013-12-16 NOTE — Anesthesia Preprocedure Evaluation (Addendum)
Anesthesia Evaluation  Patient identified by MRN, date of birth, ID band Patient awake    Reviewed: H&P , NPO status , Patient's Chart, lab work & pertinent test results  History of Anesthesia Complications (+) PONV  Airway Mallampati: II  Neck ROM: Limited  Mouth opening: Limited Mouth Opening  Dental  (+) Teeth Intact   Pulmonary shortness of breath, former smoker,  breath sounds clear to auscultation        Cardiovascular + Peripheral Vascular Disease + dysrhythmias Rhythm:Regular Rate:Normal  Postural ortho hypotension   Neuro/Psych Anxiety Cerv spinal stenosis c myelopathy TIA Neuromuscular disease CVA    GI/Hepatic negative GI ROS, Neg liver ROS,   Endo/Other  negative endocrine ROS  Renal/GU      Musculoskeletal   Abdominal   Peds  Hematology   Anesthesia Other Findings   Reproductive/Obstetrics                          Anesthesia Physical Anesthesia Plan  ASA: III  Anesthesia Plan: General   Post-op Pain Management:    Induction: Intravenous  Airway Management Planned: Oral ETT and Video Laryngoscope Planned  Additional Equipment:   Intra-op Plan:   Post-operative Plan: Extubation in OR  Informed Consent: I have reviewed the patients History and Physical, chart, labs and discussed the procedure including the risks, benefits and alternatives for the proposed anesthesia with the patient or authorized representative who has indicated his/her understanding and acceptance.   Dental advisory given  Plan Discussed with: CRNA and Surgeon  Anesthesia Plan Comments:        Anesthesia Quick Evaluation

## 2013-12-16 NOTE — Progress Notes (Signed)
12/16/2013 8:01 PM  Michele [name] [id number]  Post-Op Check   Temp:  [97.8 F (36.6 C)-98.2 F (36.8 C)] 98.1 F (36.7 C) (03/27 1735) Pulse Rate:  [52-134] 93 (03/27 1735) Resp:  [7-20] 16 (03/27 1735) BP: (114-168)/(78-91) 114/87 mmHg (03/27 1735) SpO2:  [94 %-100 %] 98 % (03/27 1735) Weight:  [60.7 kg (133 lb 13.1 oz)] 60.7 kg (133 lb 13.1 oz) (03/27 1230),     Intake/Output Summary (Last 24 hours) at 12/16/13 2001 Last data filed at 12/16/13 1735  Gross per 24 hour  Intake   1100 ml  Output     30 ml  Net   1070 ml    JP drain 30 ml  No results found for this or any previous visit (from the past 24 hour(s)).  SUBJECTIVE:  Min pain.  No breathing difficulty.  Spont void.  Tol po solids and liquids.  OBJECTIVE:  Wound flat, drain functional.  Voice strong and clear.  IMPRESSION:  Satisfactory check  PLAN:  Routine.  Anticipated drain out and discharge home in AM.  [name], [place]

## 2013-12-16 NOTE — Anesthesia Postprocedure Evaluation (Signed)
  Anesthesia Post-op Note  Patient: Michele [name]  Procedure(s) Performed: Procedure(s): RIGHT THYROIDECTOMY (Right)  Patient Location: PACU  Anesthesia Type:General  Level of Consciousness: awake and alert   Airway and Oxygen Therapy: Patient Spontanous Breathing  Post-op Pain: mild  Post-op Assessment: Post-op Vital signs reviewed  Post-op Vital Signs: stable  Complications: No apparent anesthesia complications

## 2013-12-16 NOTE — Transfer of Care (Signed)
Immediate Anesthesia Transfer of Care Note  Patient: Michele [name]  Procedure(s) Performed: Procedure(s): RIGHT THYROIDECTOMY (Right)  Patient Location: PACU  Anesthesia Type:General  Level of Consciousness: awake, alert  and oriented  Airway & Oxygen Therapy: Patient Spontanous Breathing and Patient connected to nasal cannula oxygen  Post-op Assessment: Report given to PACU RN and Post -op Vital signs reviewed and stable  Post vital signs: Reviewed and stable  Complications: No apparent anesthesia complications

## 2013-12-16 NOTE — Brief Op Note (Signed)
12/16/2013  11:13 AM  PATIENT:  Michele [name]  63 y.o. female  PRE-OPERATIVE DIAGNOSIS:  Thyroid Nodule  POST-OPERATIVE DIAGNOSIS:  Thyroid Nodule  PROCEDURE:  Procedure(s): RIGHT THYROIDECTOMY (Right)  SURGEON:  Surgeon(s) and Role:    * Melida Quitter, MD - Assisting    * Jerrell Belfast, MD - Primary  PHYSICIAN ASSISTANT:   ASSISTANTS: Tanja Port, PA   ANESTHESIA:   general  EBL:  Total I/O In: 1100 [I.V.:1100] Out: -  < 50cc  BLOOD ADMINISTERED:none  DRAINS: (7 Fr) Jackson-Pratt drain(s) with closed bulb suction in the Rt neck   LOCAL MEDICATIONS USED:  NONE  SPECIMEN:  Source of Specimen:  Rt Thyroid lobe  DISPOSITION OF SPECIMEN:  PATHOLOGY  COUNTS:  YES  TOURNIQUET:  * No tourniquets in log *  DICTATION: .Other Dictation: Dictation Number [id number]  PLAN OF CARE: Admit for overnight observation  PATIENT DISPOSITION:  PACU - hemodynamically stable.   Delay start of Pharmacological VTE agent (>24hrs) due to surgical blood loss or risk of bleeding: no

## 2013-12-16 NOTE — H&P (Signed)
Michele [name] is an 63 y.o. female.   Chief Complaint: Thyroid mass HPI: hx of thyroid nodules, FNA of Rt side positive for follicular cells could not r/o Ca  Past Medical History  Diagnosis Date  . Herniated disc     L5-S1  . Interstitial cystitis   . Depression   . Anxiety   . History of phlebitis     from IV  . Lumbar spondylosis   . Spinal stenosis   . Endometriosis   . History of orthostatic hypotension 08-31-13  . PONV (postoperative nausea and vomiting)     last surgery okay  . Unspecified disorder of adrenal glands     questionable problem to be investigated  . Dysrhythmia     tachy episodes  . Stroke 05    tia  . Shortness of breath     exersion  . Pneumonia     hx  . 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 Multiple thyroid nodules   . History of kidney stones   . Peripheral vascular disease 78    phlebitis lft arm- iv site    Past Surgical History  Procedure Laterality Date  . Cesarean section  1985  . Total abdominal hysterectomy w/ bilateral salpingoophorectomy  2004  . Tonsillectomy and adenoidectomy  1958  . Lithotripsy    . Nasal sinus surgery  1996  . Vaginal birth after cesarean section  1986  . Laminotomy  2004    RIGHT INTERLAMINAR LAMINOTOMY, MICRODISKECTOMY, L5-S1     Family History  Problem Relation Age of Onset  . Hypertension Mother   . Heart disease Mother   . Anxiety disorder Mother   . Arthritis Mother   . Cervical cancer Mother   . Prostate cancer Father   . Hypertension Father   . Heart disease Father   . Breast cancer Sister     premenopausal  . Hepatitis C Sister   . Fibromyalgia Sister   . Hypertension Sister   . Heart disease Sister   . Hemochromatosis Brother   . Hypothyroidism Father   . Gout Father   . Goiter Mother   . Hypertension Paternal Grandfather   . CVA Paternal Grandfather 3  . Heart attack Paternal Grandfather   . Heart attack Paternal Grandmother   . CVA Paternal Grandmother 77  . Hyperlipidemia  Paternal Grandmother   . Hypertension Paternal Grandmother   . Hypertension Maternal Grandfather   . Heart attack Maternal Grandfather 66  . Hyperlipidemia Maternal Grandfather   . Hypertension Maternal Grandmother   . Heart attack Maternal Grandmother   . Hyperlipidemia Maternal Grandmother   . Heart attack Sister 69   Social History:  reports that she quit smoking about 20 months ago. Her smoking use included Cigarettes. She has a 25 pack-year smoking history. She does not have any smokeless tobacco history on file. She reports that she does not drink alcohol or use illicit drugs.  Allergies:  Allergies  Allergen Reactions  . Iodine Rash  . Latex Rash  . Shellfish Allergy Other (See Comments)    Blood pressure drops    Medications Prior to Admission  Medication Sig Dispense Refill  . ALPRAZolam (XANAX) 0.5 MG tablet Take 1 tablet by mouth daily as needed for anxiety.       Marland Kitchen aspirin EC 81 MG tablet Take 1 tablet (81 mg total) by mouth daily.      . fludrocortisone (FLORINEF) 0.1 MG tablet Take 0.1  mg by mouth daily.      Marland Kitchen HYDROcodone-acetaminophen (NORCO) 10-325 MG per tablet Take 1 tablet by mouth every 6 (six) hours as needed for moderate pain.       . metoprolol tartrate (LOPRESSOR) 25 MG tablet 1 and 1/2 tablet twice a day      . topiramate (TOPAMAX) 25 MG tablet Take 25 mg by mouth 2 (two) times daily.        No results found for this or any previous visit (from the past 48 hour(s)). No results found.  Review of Systems  Constitutional: Negative.   HENT: Negative.   Respiratory: Negative.   Cardiovascular: Positive for orthopnea.  Gastrointestinal: Negative.   Musculoskeletal: Negative.     Blood pressure 168/78, pulse 52, temperature 98.2 F (36.8 C), temperature source Oral, resp. rate 20, SpO2 100.00%. Physical Exam  Constitutional: She is oriented to person, place, and time. She appears well-developed and well-nourished.  Neck: Normal range of motion. Neck  supple. Thyromegaly present.  Cardiovascular: Normal rate.   Respiratory: Effort normal.  GI: Soft.  Musculoskeletal: Normal range of motion.  Neurological: She is alert and oriented to person, place, and time.     Assessment/Plan Adm for Rt thyroidectomy with adm and O/N obs.  [place], Matison Nuccio 12/16/2013, 8:22 AM

## 2013-12-16 NOTE — Discharge Instructions (Signed)
Thyroidectomy  Care After  Refer to this sheet in the next few weeks. These instructions provide you with general information on caring for yourself after you leave the hospital. Your caregiver also may give you specific instructions. Your treatment has been planned according to the most current medical practices available, but problems sometimes occur. Call your caregiver if you have any problems or questions after your procedure.  HOME CARE INSTRUCTIONS    It is normal to be sore for a few weeks following surgery. See your caregiver if your pain seems to be getting worse rather than better.   Only take over-the-counter or prescription medicines for pain, discomfort, or fever as directed by your caregiver. Avoid taking medicines that contain aspirin and ibuprofen because they increase the risk of bleeding.   Shower rather than bathe until instructed otherwise by your caregiver.   Change your bandages (dressings) as directed by your caregiver.   You may resume a normal diet and activities as directed by your caregiver.   Avoid lifting weight greater than 20 lb (9 kg) or participating in heavy exercise or contact sports for 10 days or as instructed by your caregiver.   Make an appointment to see your caregiver for stitch (suture) or staple removal.  SEEK MEDICAL CARE IF:    You have increased bleeding from your wound.   You have redness, swelling, or increasing pain from your wound or in your neck.   There is pus coming from your wound.   You have an oral temperature above 102 F (38.9 C).   There is a bad smell coming from the wound or dressing.   You develop lightheadedness or feel faint.   You develop numbness, tingling, or muscle spasms in your arms, hands, feet, or face.   You have difficulty swallowing.  SEEK IMMEDIATE MEDICAL CARE IF:    You develop a rash.   You have difficulty breathing.   You hear whistling noises that come from your chest.   You develop a cough that becomes increasingly  worse.   You develop any reaction or side effects to medicines given.   There is swelling in your neck.   You develop changes in speech or hoarseness, which is getting worse.  MAKE SURE YOU:    Understand these instructions.   Will watch your condition.   Will get help right away if you are not doing well or get worse.  Document Released: 03/28/2005 Document Revised: 12/01/2011 Document Reviewed: 11/15/2010  ExitCare Patient Information 2014 [employer].

## 2013-12-16 NOTE — Anesthesia Procedure Notes (Signed)
Procedure Name: Intubation Date/Time: 12/16/2013 9:11 AM Performed by: Manuela Schwartz B Pre-anesthesia Checklist: Patient identified, Emergency Drugs available, Suction available, Patient being monitored and Timeout performed Patient Re-evaluated:Patient Re-evaluated prior to inductionOxygen Delivery Method: Circle system utilized Preoxygenation: Pre-oxygenation with 100% oxygen Intubation Type: IV induction and Rapid sequence Grade View: Grade I Tube type: Oral Tube size: 7.0 mm Number of attempts: 1 Airway Equipment and Method: Stylet and Video-laryngoscopy (elective glidescope intubation) Placement Confirmation: ETT inserted through vocal cords under direct vision,  positive ETCO2 and breath sounds checked- equal and bilateral Secured at: 21 cm Tube secured with: Tape Dental Injury: Teeth and Oropharynx as per pre-operative assessment

## 2013-12-16 NOTE — Op Note (Deleted)
[name], [name]                ACCOUNT NO.:  [id number]  MEDICAL RECORD NO.:  [id number]  LOCATION:                               FACILITY:  [facility]  PHYSICIAN:  Michele [name], M.D.DATE OF BIRTH:  September 23, 1950  DATE OF PROCEDURE:  12/16/2013 DATE OF DISCHARGE:  12/12/2013                              OPERATIVE REPORT   PREOPERATIVE DIAGNOSIS:  Right thyroid lobe atypical mass.  POSTOPERATIVE DIAGNOSIS:  Right thyroid lobe atypical mass.  INDICATION FOR SURGERY:  Right thyroid lobe atypical mass.  SURGICAL PROCEDURE:  Right thyroidectomy.  SURGEON:  Michele Chars. Wilburn Cornelia, MD.  ASSISTANT:  Michele [name], [facility]  ANESTHESIA:  General endotracheal.  ESTIMATED BLOOD LOSS:  Minimal.  COMPLICATIONS:  None.  DISPOSITION:  The patient transferred from the operating room to the recovery room in stable condition.  BRIEF HISTORY:  The patient is a 63 year old white female, referred to our office for evaluation of a right thyroid mass which on ultrasound- guided fine needle biopsy showed atypical cells, unable to rule out thyroid carcinoma.  Given the patient's history and findings, we discussed treatment options including observation and rebiopsy versus right hemithyroidectomy under general anesthesia.  The patient was concerned about the possibility of malignancy and elected to proceed with surgical intervention.  The risks and benefits of right hemithyroidectomy were discussed in detail.  The patient understood and concurred with our plan for surgery which is scheduled at [facility] under general anesthesia with anticipated overnight observation.  DESCRIPTION OF PROCEDURE:  The patient was brought to the operating room on December 16, 2013, and placed in supine position on the operating table. General endotracheal anesthesia was established without difficulty. When the patient was adequately anesthetized, she was positioned on the operating table, prepped and draped in  sterile fashion.  A 5 cm horizontally oriented skin incision was created in a preexisting skin crease and carried through the skin underlying subcutaneous tissue to the level of the platysmal fascia.  The anterior jugular veins were identified, divided, and suture ligated, and a subplatysmal flap was elevated superiorly and inferiorly, allowing direct access to the anterior compartment of the neck.  The trachea and anterior aspect of the thyroid gland were visualized.  The strap muscles were divided in the midline and lateralized, and is allowed access to the peritracheal region.  Palpation of the left thyroid lobe revealed no palpable nodule or mass.  Attention was then turned to the right side where a right hemithyroidectomy was performed.  The capsule of the gland was identified and strap muscles were lateralized.  Careful dissection along the capsule was undertaken.  The middle thyroid vein was divided and suture ligated.  The superior thyroid pedicle was carefully dissected and divided and ligated with a 2-0 silk suture ties.  The gland was then reflected from lateral to medial.  The inferior component of the gland was dissected and the inferior thyroid artery and vein were divided and ligated with the Harmonic Scalpel.  The thyroid isthmus was then carefully dissected.  There was a small pyramidal lobe adjacent to the isthmus and this was resected as part of the surgical specimen.  The isthmus was dissected free  of the trachea and divided with Bovie electrocautery.  The gland was then rotated further anteriorly and Barry's ligament was dissected with electrocautery and blunt and sharp dissection, removing the entire right thyroid lobe.  The right recurrent laryngeal nerve was left within the right tracheoesophageal groove and was not further explored.  The surgical specimen was removed and sent to Pathology for frozen section analysis.  The previously biopsied nodule was consistent  with follicular cells on frozen section.  No further pathologic determination was made and final diagnosis deferred to permanent pathology.  The procedure was then concluded.  The left thyroid lobe was not removed.  The patient's surgical wound was then thoroughly irrigated with saline.  Small areas of point hemorrhage were cauterized with bipolar cautery.  A 7-French round drain was then placed at the depth of the incision and sutured with a 5-0 Ethilon sutured to the skin.  The incision was then closed in multiple layers beginning with reapproximation of midline strap muscles with interrupted 3-0 Vicryl suture.  Deep subcutaneous tissue closed with 3-0 Vicryl suture. Superficial subcutaneous layer closed with interrupted 4-0 Vicryl and interrupted subcuticular closure with horizontal mattress sutures of 4-0 Vicryl.  Final skin edge closed with Dermabond surgical glue.  The patient was awakened from anesthetic, extubated, and was transferred from the operating room to recovery room in stable condition.  No complications and estimated blood loss was minimal.          ______________________________ Michele [name], M.D.     DLS/MEDQ  D:  30/16/0109  T:  12/16/2013  Job:  [id number]

## 2013-12-16 NOTE — Progress Notes (Signed)
12/16/13 0735  Clinical Encounter Type  Visited With Health care provider  Visit Type (advance directives)  Referral From Nurse   Consulted with RN re Advance Directives question.  Per RN, pt had paperwork complete but needed notary.  Informed RN that chaplains are not notaries and suggested other routes to locate one.    [place], [place]

## 2013-12-17 NOTE — Progress Notes (Signed)
Discharge instructions gone over with patient. Home medications gone over. Follow up appointment is made. Diet, activity, incisional care, and signs and symptoms of infection gone over. Signs of tetany discussed.  Patient already has my chart. No prescriptions at this time.  Patient verbalized understanding of instructions.

## 2013-12-17 NOTE — Discharge Summary (Signed)
  12/17/2013 12:15 PM  Michele [name] [id number]  Post-Op Day 1    Temp:  [97.5 F (36.4 C)-98.2 F (36.8 C)] 97.5 F (36.4 C) (03/28 1030) Pulse Rate:  [53-93] 53 (03/28 1030) Resp:  [16-18] 18 (03/28 1030) BP: (108-158)/(72-87) 138/78 mmHg (03/28 1030) SpO2:  [97 %-100 %] 100 % (03/28 1030) Weight:  [60.7 kg (133 lb 13.1 oz)] 60.7 kg (133 lb 13.1 oz) (03/27 1230),     Intake/Output Summary (Last 24 hours) at 12/17/13 1215 Last data filed at 12/17/13 1029  Gross per 24 hour  Intake 2062.5 ml  Output     45 ml  Net 2017.5 ml   JP 45 ml  No results found for this or any previous visit (from the past 24 hour(s)).  SUBJECTIVE:  Min pain.  No SOB.  Eating and drinking.  Voice clear  OBJECTIVE:  Wound flat.  Drain d/c'd.  Breathing well.  IMPRESSION:  Satisfactory check  PLAN:  Discharge home  Admit:  68 MAR Discharge:  28 MAR Final Diagnosis:  Thyroid Neoplasm Proc:  Thyroid lobectomy, 27 MAR Comp:  None Cond: ambulatory.  Pain controlled.  Voice strong.  Taking good po's.   Recheck: 10 days Dr. Wilburn Cornelia Instructions written and given  Hosp Course:  Underwent thyroid lobectomy.  p op advancement of diet and activity.  Drain discontinued and pt discharged to home and care of family on POD 1.    Jodi Marble

## 2013-12-20 ENCOUNTER — Encounter (HOSPITAL_COMMUNITY): Payer: Self-pay | Admitting: Otolaryngology

## 2014-01-11 ENCOUNTER — Other Ambulatory Visit (HOSPITAL_COMMUNITY)
Admission: RE | Admit: 2014-01-11 | Discharge: 2014-01-11 | Disposition: A | Discharge status: Home / Self Care | Payer: BC Managed Care – PPO | Source: Ambulatory Visit | Attending: Obstetrics & Gynecology | Admitting: Obstetrics & Gynecology

## 2014-01-11 ENCOUNTER — Other Ambulatory Visit: Payer: Self-pay | Admitting: Obstetrics & Gynecology

## 2014-01-11 DIAGNOSIS — Z1151 Encounter for screening for human papillomavirus (HPV): Secondary | ICD-10-CM | POA: Insufficient documentation

## 2014-01-11 DIAGNOSIS — Z01419 Encounter for gynecological examination (general) (routine) without abnormal findings: Secondary | ICD-10-CM | POA: Insufficient documentation

## 2014-01-12 ENCOUNTER — Ambulatory Visit (INDEPENDENT_AMBULATORY_CARE_PROVIDER_SITE_OTHER): Payer: BC Managed Care – PPO | Admitting: Neurology

## 2014-01-12 ENCOUNTER — Encounter: Payer: Self-pay | Admitting: Neurology

## 2014-01-12 VITALS — BP 125/85 | HR 49 | Ht 63.0 in | Wt 126.0 lb

## 2014-01-12 DIAGNOSIS — R209 Unspecified disturbances of skin sensation: Secondary | ICD-10-CM

## 2014-01-12 MED ORDER — TOPIRAMATE 50 MG PO TABS
50.0000 mg | ORAL_TABLET | Freq: Two times a day (BID) | ORAL | Status: DC
Start: 2014-01-12 — End: 2023-06-03

## 2014-01-12 NOTE — Progress Notes (Signed)
[facility] [address] [place]. [place] [address] [phone]       OFFICE CONSULT NOTE  Michele. Michele [name] Date of Birth:  [birth year] Medical Record Number:  [id number]   Referring MD:  Basil Dess  Reason for Referral:  numbness  HPI: Michele [name] is a 84 year pleasant Caucasian lady whose had chronic neck pain and paresthesias her last 4-5 years which seems to have gotten worse in the last year or so. She has known history of cervical spinal stenosis which was diagnosed first in 2004 when she had imaging studies. She underwent back surgery at that time. Over the years and neck pain and intermittent hand paresthesias into the gotten worse. She describes these as happening intermittently and is related to change in neck or body position. She describes numbness involving whole left choroid upper extremity which is relieved by changing position. She also describes pain in the back of the neck and at times it shoots up into the back of her head. This is constant. She describes soreness and tenderness of her neck muscles. She also describes an intermittent transient sharp shooting headaches which last for a few seconds and a severe. She has been taking the Norco pain medication which provides good relief but does not help the numbness. She has tried gabapentin in the past but it did not help her numbness. She was seen by Dr. Ace Gins from pain management who did nerve conduction study a year ago and diagnosed her with carpal tunnel on the right. She does have a wrist extension splint but she uses it only at night. She does do Pilates and yoga regularly and plans to start swimming soon as well. She underwent MRI scan of the cervical spine [place] 09/10/39 which I have personally reviewed and shows severe spinal stenosis at C4-5 and C5-6 with AP canal diameter reduction to 6 and 7 mm respectively with disc protrusion. There is possible left C4 nerve root impingement due to bony spur. This  apparently this has progressed compared to previous MRI scan from 10 years ago but I do not have those films to look at today. I do not have the local infection results either but the patient informs me that EMG was not done. She is keen to avoid surgery in the neck and would like to try nonsurgical treatment if possible. On inquiry she admits to intermittent balance difficulties and she has had several falls the last one year but none of them was a major involving significant injury. She does not have any significant bladder urgency or incontinence. She has no significant weakness in her hands or legs. She does admit to some diminished fine motor skills and mild tremulousness of her hands while performing certain tasks. Update 01/12/2014 : She returns for followup last visit 3 months ago. She is tolerating Topamax 25 twice a day and feels that is helping her but she wants to reduce and preferably stop hydrocodone possible. She has decreased her dose from 60-40 mg per oral. She is doing Pilates and exercises regularly. She states her right hand numbness and pain is also good. She does not want to do another EMG nerve conduction study and is willing to get me the results from the one she had last year at an outside place. She had thyroid surgery on March 27 for a goiter and pathology turned out to be benign. She plans to see an endocrinologist  Dr Buddy Duty soon to be started on hormone replacement.  ROS:   14 system review of systems is positive for weight gain and loss, fatigue, chest pain, palpitations, loss of vision, shortness of breath, trouble swallowing, chewing, moles, easy bruising, feeling hot and cold, flushing, joint pain, aching muscles, runny nose, memory loss, confusion, headache, numbness, weakness, dizziness, tremor, depression, anxiety, decreased energy, insomnia and disinterest in activities.  PMH:  Past Medical History  Diagnosis Date  . Herniated disc     L5-S1  . Interstitial cystitis   .  Depression   . Anxiety   . History of phlebitis     from IV  . Lumbar spondylosis   . Spinal stenosis   . Endometriosis   . History of orthostatic hypotension 08-31-13  . PONV (postoperative nausea and vomiting)     last surgery okay  . Unspecified disorder of adrenal glands     questionable problem to be investigated  . Dysrhythmia     tachy episodes  . Stroke 05    tia  . Shortness of breath     exersion  . Pneumonia     hx  . 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 Multiple thyroid nodules   . History of kidney stones   . Peripheral vascular disease 78    phlebitis lft arm- iv site  . Phlebitis     Social History:  History   Social History  . Marital Status: Married    Spouse Name: N/A    Number of Children: 2  . Years of Education: college2   Occupational History  . retired    Social History Main Topics  . Smoking status: Former Smoker -- 1.00 packs/day for 25 years    Types: Cigarettes    Quit date: 04/13/2012  . Smokeless tobacco: Never Used     Comment: quit july 2013  . Alcohol Use: No  . Drug Use: No  . Sexual Activity: Not on file   Other Topics Concern  . Not on file   Social History Narrative  . No narrative on file    Medications:   Current Outpatient Prescriptions on File Prior to Visit  Medication Sig Dispense Refill  . ALPRAZolam (XANAX) 0.5 MG tablet Take 1 tablet by mouth daily as needed for anxiety.       . fludrocortisone (FLORINEF) 0.1 MG tablet Take 0.1 mg by mouth daily.      Marland Kitchen HYDROcodone-acetaminophen (NORCO) 10-325 MG per tablet Take 1 tablet by mouth every 6 (six) hours as needed for moderate pain.       . metoprolol tartrate (LOPRESSOR) 25 MG tablet 1 and 1/2 tablet twice a day       No current facility-administered medications on file prior to visit.    Allergies:   Allergies  Allergen Reactions  . Iodine Rash  . Latex Rash  . Shellfish Allergy Other (See Comments)    Blood pressure drops    Physical  Exam General: well developed, well nourished middle aged Caucasian lady, seated, in no evident distress Head: head normocephalic and atraumatic. Orohparynx benign Neck: supple with no carotid or supraclavicular bruits Cardiovascular: regular rate and rhythm, no murmurs Musculoskeletal: no deformity Skin:  no rash/petichiae Vascular:  Normal pulses all extremities Filed Vitals:   01/12/14 1519  BP: 125/85  Pulse: 49    Neurologic Exam Mental Status: Awake and fully alert. Oriented to place and time. Recent and remote memory intact. Attention span, concentration and fund of knowledge appropriate. Mood and affect  appropriate.  Cranial Nerves: Fundoscopic exam reveals sharp disc margins. Pupils equal, briskly reactive to light. Extraocular movements full without nystagmus. Visual fields full to confrontation. Hearing intact. Facial sensation intact. Face, tongue, palate moves normally and symmetrically.  Motor: Normal bulk and tone. Normal strength in all tested extremity muscles. Sensory.: intact to touch and pinprick and position sense Tinel's sign negative over both wrists. Slight diminished vibration over toes bilaterally Coordination: Rapid alternating movements normal in all extremities. Finger-to-nose and heel-to-shin performed accurately bilaterally. Gait and Station: Arises from chair without difficulty. Stance is normal. Gait demonstrates normal stride length and balance . Able to heel, toe and tandem walk without difficulty.  Reflexes: 2+ and symmetric and brisk in upper extremities and both knee jerks 1+ and ankle jerks depressed. Toes downgoing.   ASSESSMENT: 6 year lady long-standing history of intermittent hand paresthesias and chronic neck pain likely from degenerative cervical spine disease with spinal stenosis as well as a component of carpal tunnel syndrome. Chronic transient headaches likely also of musculoskeletal etiology    PLAN:I had a long discussion with the patient  regarding his symptoms, treatment plan and answered questions. Recommend increase Topamax to 25 mg in a.m. and 50mg  at night for 2 weeks and if tolerated further to 50 mg twice daily. Continue regular exercises and Pilates. Return for followup in 3 months with Jeani Hawking, nurse practitioner or call earlier if necessary  Note: This document was prepared with digital dictation and possible smart phrase technology. Any transcriptional errors that result from this process are unintentional.

## 2014-01-12 NOTE — Patient Instructions (Signed)
I had a long discussion with the patient regarding his symptoms, treatment plan and answered questions. Recommend increase Topamax to 25 mg in a.m. and 50mg  at night for 2 weeks and if tolerated further to 50 mg twice daily. Continue regular exercises and Pilates. Return for followup in 3 months with Jeani Hawking, nurse practitioner or call earlier if necessary

## 2014-04-19 ENCOUNTER — Ambulatory Visit: Payer: BC Managed Care – PPO | Admitting: Nurse Practitioner

## 2014-12-15 DIAGNOSIS — E063 Autoimmune thyroiditis: Secondary | ICD-10-CM | POA: Insufficient documentation

## 2014-12-15 DIAGNOSIS — N200 Calculus of kidney: Secondary | ICD-10-CM | POA: Insufficient documentation

## 2014-12-15 DIAGNOSIS — E559 Vitamin D deficiency, unspecified: Secondary | ICD-10-CM | POA: Insufficient documentation

## 2015-01-11 DIAGNOSIS — R7303 Prediabetes: Secondary | ICD-10-CM | POA: Insufficient documentation

## 2015-08-15 DIAGNOSIS — F419 Anxiety disorder, unspecified: Secondary | ICD-10-CM | POA: Insufficient documentation

## 2015-08-15 DIAGNOSIS — G4733 Obstructive sleep apnea (adult) (pediatric): Secondary | ICD-10-CM | POA: Insufficient documentation

## 2015-10-03 DIAGNOSIS — M5116 Intervertebral disc disorders with radiculopathy, lumbar region: Secondary | ICD-10-CM | POA: Insufficient documentation

## 2016-03-04 ENCOUNTER — Telehealth: Payer: Self-pay | Admitting: *Deleted

## 2016-03-04 NOTE — Telephone Encounter (Signed)
Records faxed, to [facility] on 03/04/16 to [phone].

## 2019-11-13 ENCOUNTER — Ambulatory Visit: Payer: PRIVATE HEALTH INSURANCE | Attending: Internal Medicine

## 2019-11-13 DIAGNOSIS — Z23 Encounter for immunization: Secondary | ICD-10-CM | POA: Insufficient documentation

## 2019-11-13 NOTE — Progress Notes (Signed)
   Covid-19 Vaccination Clinic  Name:  Michele [name]    MRN: [id number] DOB: 1951/07/16  11/13/2019  [name] was observed post Covid-19 immunization for 15 minutes without incidence. She was provided with Vaccine Information Sheet and instruction to access the V-Safe system.   [name] was instructed to call 911 with any severe reactions post vaccine: Marland Kitchen Difficulty breathing  . Swelling of your face and throat  . A fast heartbeat  . A bad rash all over your body  . Dizziness and weakness    Immunizations Administered    Name Date Dose VIS Date Route   Pfizer COVID-19 Vaccine 11/13/2019 11:43 AM 0.3 mL 09/02/2019 Intramuscular   Manufacturer: [place]   Lot: [id number]   [place]: [id number]

## 2019-12-07 ENCOUNTER — Ambulatory Visit: Payer: PRIVATE HEALTH INSURANCE | Attending: Internal Medicine

## 2019-12-07 DIAGNOSIS — Z23 Encounter for immunization: Secondary | ICD-10-CM

## 2019-12-07 NOTE — Progress Notes (Signed)
   Covid-19 Vaccination Clinic  Name:  Michele [name]    MRN: [id number] DOB: 09/29/1950  12/07/2019  Michele [name] was observed post Covid-19 immunization for 15 minutes without incident. She was provided with Vaccine Information Sheet and instruction to access the V-Safe system.   Michele [name] was instructed to call 911 with any severe reactions post vaccine: Marland Kitchen Difficulty breathing  . Swelling of face and throat  . A fast heartbeat  . A bad rash all over body  . Dizziness and weakness   Immunizations Administered    Name Date Dose VIS Date Route   Pfizer COVID-19 Vaccine 12/07/2019  8:38 AM 0.3 mL 09/02/2019 Intramuscular   Manufacturer: [place]   Lot: [id number]   [place]: [id number]

## 2020-01-27 ENCOUNTER — Other Ambulatory Visit: Payer: Self-pay | Admitting: Physical Medicine and Rehabilitation

## 2020-01-27 DIAGNOSIS — M48061 Spinal stenosis, lumbar region without neurogenic claudication: Secondary | ICD-10-CM

## 2021-03-11 ENCOUNTER — Ambulatory Visit
Admission: RE | Admit: 2021-03-11 | Discharge: 2021-03-11 | Disposition: A | Discharge status: Home / Self Care | Payer: Medicare Other | Source: Ambulatory Visit | Attending: Physical Medicine and Rehabilitation | Admitting: Physical Medicine and Rehabilitation

## 2021-03-11 ENCOUNTER — Other Ambulatory Visit: Payer: Self-pay | Admitting: Physical Medicine and Rehabilitation

## 2021-03-11 DIAGNOSIS — M25561 Pain in right knee: Secondary | ICD-10-CM

## 2021-04-12 ENCOUNTER — Encounter: Payer: Self-pay | Admitting: Emergency Medicine

## 2021-04-12 ENCOUNTER — Ambulatory Visit
Admission: EM | Admit: 2021-04-12 | Discharge: 2021-04-12 | Disposition: A | Discharge status: Home / Self Care | Payer: Medicare Other | Attending: Emergency Medicine | Admitting: Emergency Medicine

## 2021-04-12 ENCOUNTER — Other Ambulatory Visit: Payer: Self-pay

## 2021-04-12 DIAGNOSIS — R3915 Urgency of urination: Secondary | ICD-10-CM | POA: Insufficient documentation

## 2021-04-12 DIAGNOSIS — U071 COVID-19: Secondary | ICD-10-CM | POA: Diagnosis present

## 2021-04-12 LAB — POCT URINALYSIS DIP (MANUAL ENTRY)
Bilirubin, UA: NEGATIVE
Glucose, UA: NEGATIVE mg/dL
Ketones, POC UA: NEGATIVE mg/dL
Nitrite, UA: NEGATIVE
Protein Ur, POC: NEGATIVE mg/dL
Spec Grav, UA: 1.015 (ref 1.010–1.025)
Urobilinogen, UA: 0.2 E.U./dL
pH, UA: 5 (ref 5.0–8.0)

## 2021-04-12 MED ORDER — ALBUTEROL SULFATE HFA 108 (90 BASE) MCG/ACT IN AERS: 1.0000 | INHALATION_SPRAY | Freq: Four times a day (QID) | RESPIRATORY_TRACT | 0 refills | Status: DC | PRN

## 2021-04-12 MED ORDER — BENZONATATE 100 MG PO CAPS: 100.0000 mg | ORAL_CAPSULE | Freq: Three times a day (TID) | ORAL | 0 refills | Status: DC

## 2021-04-12 NOTE — ED Triage Notes (Signed)
Pt tested positive today for COVID.  Pt wants to be prescribed antiviral for COVID.

## 2021-04-12 NOTE — ED Provider Notes (Signed)
[facility]    CSN: [id number] Arrival date & time: 04/12/21  1340      History   Chief Complaint Chief Complaint  Patient presents with   Covid Positive    HPI Michele [name] is a 70 y.o. female presenting today for evaluation of COVID infection.  Reports that she began to feel sick yesterday with cough, congestion, fatigue and night sweats.  She reports COVID exposure ended at home test this morning which was positive.  Reports history of prior tobacco use, but no known history of COPD.  Reports some slight chest tightness.  Also reports associated urgency to urinate, but has not been urinating, denies dysuria.  Concerned about possible UTI or need to drink more water.  HPI  Past Medical History:  Diagnosis Date   Anxiety    Depression    Dysrhythmia    tachy episodes   Endometriosis    Herniated disc    L5-S1   History of kidney stones    History of orthostatic hypotension 08-31-13   History of phlebitis    from IV   Interstitial cystitis    Lumbar spondylosis    Multiple thyroid nodules    Peripheral vascular disease (HCC) 78   phlebitis lft arm- iv site   Phlebitis    Pneumonia    hx   PONV (postoperative nausea and vomiting)    last surgery okay   Shortness of breath    exersion   Spinal stenosis    Stroke ([place]) 05   tia   Tuberculosis    no tb but test pos   Unspecified disorder of adrenal glands    questionable problem to be investigated    Patient Active Problem List   Diagnosis Date Noted   Thyroid nodule 12/16/2013    Class: Chronic   Thyroid neoplasm 12/16/2013   Carpal tunnel syndrome 10/17/2013   Spinal stenosis in cervical region 10/17/2013   Cervicalgia 10/17/2013   Headache(784.0) 10/17/2013   Disturbance of skin sensation 10/17/2013   POTS (postural orthostatic tachycardia syndrome) 10/11/2013   PVC's (premature ventricular contractions) 10/11/2013   TIA (transient ischemic attack) 10/11/2013    Past Surgical History:   Procedure Laterality Date   CESAREAN SECTION  1985   LAMINOTOMY  2004   RIGHT INTERLAMINAR LAMINOTOMY, MICRODISKECTOMY, L5-S1    LITHOTRIPSY     NASAL SINUS SURGERY  1996   THYROIDECTOMY Right 12/16/2013   DR SHOEMAKER   THYROIDECTOMY Right 12/16/2013   Procedure: RIGHT THYROIDECTOMY;  Surgeon: Jerrell Belfast, MD;  Location: [facility] OR;  Service: ENT;  Laterality: Right;   [place] W/ BILATERAL SALPINGOOPHORECTOMY  2004   VAGINAL BIRTH AFTER [place]    OB History   No obstetric history on file.      Home Medications    Prior to Admission medications   Medication Sig Start Date End Date Taking? Authorizing Provider  albuterol (VENTOLIN HFA) 108 (90 Base) MCG/ACT inhaler Inhale 1-2 puffs into the lungs every 6 (six) hours as needed for wheezing or shortness of breath. 04/12/21  Yes Aizah Gehlhausen C, PA-C  benzonatate (TESSALON) 100 MG capsule Take 1-2 capsules (100-200 mg total) by mouth every 8 (eight) hours. 04/12/21  Yes Shereece Wellborn C, PA-C  ALPRAZolam (XANAX) 0.5 MG tablet Take 1 tablet by mouth daily as needed for anxiety.  09/07/13   [provider]  fludrocortisone (FLORINEF) 0.1 MG tablet Take 0.1 mg by  mouth daily. 10/11/13   Larey Dresser, MD  HYDROcodone-acetaminophen [facility]) 10-325 MG per tablet Take 1 tablet by mouth every 6 (six) hours as needed for moderate pain.  09/11/13   [provider]  metoprolol tartrate (LOPRESSOR) 25 MG tablet 1 and 1/2 tablet twice a day 11/18/13   Larey Dresser, MD  topiramate (TOPAMAX) 50 MG tablet Take 1 tablet (50 mg total) by mouth 2 (two) times daily. 01/12/14   Garvin Fila, MD  Vitamin D, Ergocalciferol, (DRISDOL) 50000 UNITS CAPS capsule  01/04/14   [provider]    Family History Family History  Problem Relation Age of Onset   Hypertension Mother    Heart disease Mother    Anxiety disorder Mother    Arthritis Mother     Cervical cancer Mother    Prostate cancer Father    Hypertension Father    Heart disease Father    Breast cancer Sister        premenopausal   Hepatitis C Sister    Fibromyalgia Sister    Hypertension Sister    Heart disease Sister    Hemochromatosis Brother    Hypothyroidism Father    Gout Father    Goiter Mother    Hypertension Paternal Grandfather    CVA Paternal Grandfather 96   Heart attack Paternal Grandfather    Heart attack Paternal Grandmother    CVA Paternal Grandmother 49   Hyperlipidemia Paternal Grandmother    Hypertension Paternal Grandmother    Hypertension Maternal Grandfather    Heart attack Maternal Grandfather 55   Hyperlipidemia Maternal Grandfather    Hypertension Maternal Grandmother    Heart attack Maternal Grandmother    Hyperlipidemia Maternal Grandmother    Heart attack Sister 51    Social History Social History   Tobacco Use   Smoking status: Former    Packs/day: 1.00    Years: 25.00    Pack years: 25.00    Types: Cigarettes    Quit date: 04/13/2012    Years since quitting: 9.0   Smokeless tobacco: Never   Tobacco comments:    quit july 2013  Substance Use Topics   Alcohol use: No   Drug use: No     Allergies   Iodine, Latex, and Shellfish allergy   Review of Systems Review of Systems  Constitutional:  Negative for activity change, appetite change, chills, fatigue and fever.  HENT:  Positive for congestion, rhinorrhea, sinus pressure and sore throat. Negative for ear pain and trouble swallowing.   Eyes:  Negative for discharge and redness.  Respiratory:  Positive for cough. Negative for chest tightness and shortness of breath.   Cardiovascular:  Negative for chest pain.  Gastrointestinal:  Negative for abdominal pain, diarrhea, nausea and vomiting.  Genitourinary:  Positive for urgency. Negative for dysuria.  Musculoskeletal:  Negative for myalgias.  Skin:  Negative for rash.  Neurological:  Negative for dizziness,  light-headedness and headaches.    Physical Exam Triage Vital Signs ED Triage Vitals  Enc Vitals Group     BP      Pulse      Resp      Temp      Temp src      SpO2      Weight      Height      Head Circumference      Peak Flow      Pain Score      Pain Loc  Pain Edu?      Excl. in [place]?    No data found.  Updated Vital Signs BP (!) 156/93 (BP Location: Left Arm)   Pulse 98   Temp 98 F (36.7 C) (Temporal)   Resp 18   Wt 140 lb (63.5 kg)   SpO2 94%   BMI 24.80 kg/m   Visual Acuity Right Eye Distance:   Left Eye Distance:   Bilateral Distance:    Right Eye Near:   Left Eye Near:    Bilateral Near:     Physical Exam Vitals and nursing note reviewed.  Constitutional:      Appearance: She is well-developed.     Comments: No acute distress  HENT:     Head: Normocephalic and atraumatic.     Ears:     Comments: Bilateral ears without tenderness to palpation of external auricle, tragus and mastoid, EAC's without erythema or swelling, TM's with good bony landmarks and cone of light. Non erythematous.      Nose: Nose normal.     Mouth/Throat:     Comments: Oral mucosa pink and moist, no tonsillar enlargement or exudate. Posterior pharynx patent and nonerythematous, no uvula deviation or swelling. Normal phonation.  Eyes:     Conjunctiva/sclera: Conjunctivae normal.  Cardiovascular:     Rate and Rhythm: Normal rate and regular rhythm.  Pulmonary:     Effort: Pulmonary effort is normal. No respiratory distress.     Comments: Breathing comfortably at rest, CTABL, no wheezing, rales or other adventitious sounds auscultated  Abdominal:     General: There is no distension.  Musculoskeletal:        General: Normal range of motion.     Cervical back: Neck supple.  Skin:    General: Skin is warm and dry.  Neurological:     Mental Status: She is alert and oriented to person, place, and time.     UC Treatments / Results  Labs (all labs ordered are listed,  but only abnormal results are displayed) Labs Reviewed  POCT URINALYSIS DIP (MANUAL ENTRY) - Abnormal; Notable for the following components:      Result Value   Color, UA light yellow (*)    Blood, UA trace-lysed (*)    Leukocytes, UA Trace (*)    All other components within normal limits  URINE CULTURE  BASIC METABOLIC PANEL    EKG   Radiology No results found.  Procedures Procedures (including critical care time)  Medications Ordered in UC Medications - No data to display  Initial Impression / Assessment and Plan / UC Course  I have reviewed the triage vital signs and the nursing notes.  Pertinent labs & imaging results that were available during my care of the patient were reviewed by me and considered in my medical decision making (see chart for details).     COVID-19 infection-patient wishes to start antivirals, check drug interactions with Paxil bid and recommended to avoid Xanax use while taking as well as limit hydrocodone use, BMP pending to check GFR in order to prescribe.  Symptomatic and supportive care.  Tessalon and albuterol prescribed, encouraged use of antihistamines, Mucinex for congestion/drainage. Possible UTI-trace leuks and trace hemoglobin on UA, will send for culture to more definitively evaluate for UTI, push fluids in the meantime while awaiting culture results, will add antibiotics if positive  Discussed strict return precautions. Patient verbalized understanding and is agreeable with plan.  Final Clinical Impressions(s) / UC Diagnoses   Final diagnoses:  COVID-19 virus infection  Urinary urgency     Discharge Instructions      Urine culture pending to further evaluate for UTI I am checking her kidney function in order to prescribe PAX LOVID Avoid Xanax while taking and please only take one half of hydrocodone Tessalon for cough Albuterol inhaler as needed May use daily over-the-counter loratadine/Claritin Rest and fluids Please  follow-up if not improving or worsening       ED Prescriptions     Medication Sig Dispense Auth. Provider   albuterol (VENTOLIN HFA) 108 (90 Base) MCG/ACT inhaler Inhale 1-2 puffs into the lungs every 6 (six) hours as needed for wheezing or shortness of breath. 1 each Lakaisha Danish C, PA-C   benzonatate (TESSALON) 100 MG capsule Take 1-2 capsules (100-200 mg total) by mouth every 8 (eight) hours. 30 capsule Lilana Blasko, [place] C, PA-C      PDMP not reviewed this encounter.   Janith Lima, [place] 04/12/21 1437

## 2021-04-12 NOTE — Discharge Instructions (Addendum)
Urine culture pending to further evaluate for UTI I am checking her kidney function in order to prescribe PAX LOVID Avoid Xanax while taking and please only take one half of hydrocodone Tessalon for cough Albuterol inhaler as needed May use daily over-the-counter loratadine/Claritin Rest and fluids Please follow-up if not improving or worsening

## 2021-04-13 ENCOUNTER — Telehealth: Payer: Self-pay | Admitting: Emergency Medicine

## 2021-04-13 LAB — BASIC METABOLIC PANEL
BUN/Creatinine Ratio: 8 — ABNORMAL LOW (ref 12–28)
BUN: 6 mg/dL — ABNORMAL LOW (ref 8–27)
CO2: 22 mmol/L (ref 20–29)
Calcium: 9.1 mg/dL (ref 8.7–10.3)
Chloride: 102 mmol/L (ref 96–106)
Creatinine, Ser: 0.77 mg/dL (ref 0.57–1.00)
Glucose: 95 mg/dL (ref 65–99)
Potassium: 4.3 mmol/L (ref 3.5–5.2)
Sodium: 139 mmol/L (ref 134–144)
eGFR: 83 mL/min/{1.73_m2} (ref >59)

## 2021-04-13 LAB — URINE CULTURE: Culture: <10000 — AB

## 2021-04-13 MED ORDER — NIRMATRELVIR/RITONAVIR (PAXLOVID)TABLET: 3.0000 | ORAL_TABLET | Freq: Two times a day (BID) | ORAL | 0 refills | Status: AC

## 2021-04-13 NOTE — Telephone Encounter (Signed)
GFR 83, sending paxlovid, discussed drug interactions at visit.

## 2021-07-19 DIAGNOSIS — Z86010 Personal history of colonic polyps: Secondary | ICD-10-CM | POA: Insufficient documentation

## 2021-07-19 DIAGNOSIS — R2681 Unsteadiness on feet: Secondary | ICD-10-CM | POA: Insufficient documentation

## 2021-08-13 LAB — HM MAMMOGRAPHY

## 2021-08-21 DIAGNOSIS — I2510 Atherosclerotic heart disease of native coronary artery without angina pectoris: Secondary | ICD-10-CM | POA: Insufficient documentation

## 2021-08-21 DIAGNOSIS — E785 Hyperlipidemia, unspecified: Secondary | ICD-10-CM | POA: Insufficient documentation

## 2021-08-29 LAB — HM DEXA SCAN

## 2021-09-12 ENCOUNTER — Other Ambulatory Visit: Payer: Self-pay | Admitting: Physical Medicine and Rehabilitation

## 2021-09-12 DIAGNOSIS — M48061 Spinal stenosis, lumbar region without neurogenic claudication: Secondary | ICD-10-CM

## 2021-09-12 DIAGNOSIS — M5416 Radiculopathy, lumbar region: Secondary | ICD-10-CM

## 2021-09-16 ENCOUNTER — Other Ambulatory Visit: Payer: Self-pay

## 2021-09-16 ENCOUNTER — Ambulatory Visit
Admission: RE | Admit: 2021-09-16 | Discharge: 2021-09-16 | Disposition: A | Discharge status: Home / Self Care | Payer: Medicare Other | Source: Ambulatory Visit | Attending: Physical Medicine and Rehabilitation | Admitting: Physical Medicine and Rehabilitation

## 2021-09-16 DIAGNOSIS — M5416 Radiculopathy, lumbar region: Secondary | ICD-10-CM

## 2021-09-16 DIAGNOSIS — M48061 Spinal stenosis, lumbar region without neurogenic claudication: Secondary | ICD-10-CM

## 2021-09-16 MED ORDER — GADOBENATE DIMEGLUMINE 529 MG/ML IV SOLN
14.0000 mL | Freq: Once | INTRAVENOUS | Status: AC | PRN
  Administered 2021-09-16: 14:00:00 14 mL via INTRAVENOUS

## 2021-11-20 DIAGNOSIS — I471 Supraventricular tachycardia, unspecified: Secondary | ICD-10-CM | POA: Insufficient documentation

## 2021-11-20 DIAGNOSIS — I10 Essential (primary) hypertension: Secondary | ICD-10-CM | POA: Insufficient documentation

## 2022-01-03 IMAGING — MR MR LUMBAR SPINE WO/W CM
4 of 7 series · 28 of 48 positions shown · IV contrast (multihance)
Comparison: None.

CLINICAL DATA: Back pain and bilateral leg pain

EXAM:
MRI LUMBAR SPINE WITHOUT AND WITH CONTRAST
TECHNIQUE: Multiplanar and multiecho pulse sequences of the lumbar spine were
obtained without and with intravenous contrast.
CONTRAST:  14mL MULTIHANCE GADOBENATE DIMEGLUMINE 529 MG/ML IV SOLN

[Series 2: T2 · sagittal · 4.0mm · 0.53mm/px · 3 of 13 slices shown (1 of 2)]
[im 1/13]
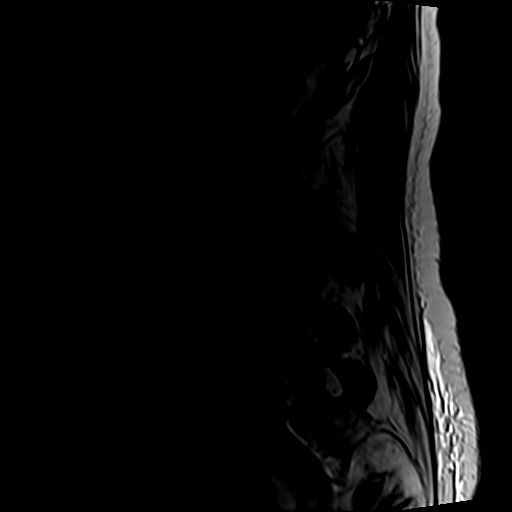
[im 7/13]
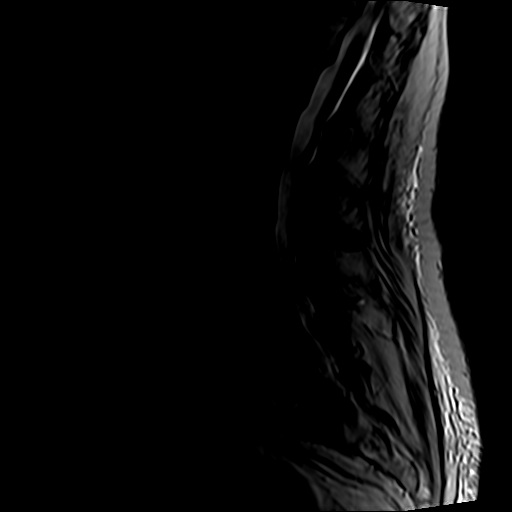
[im 13/13]
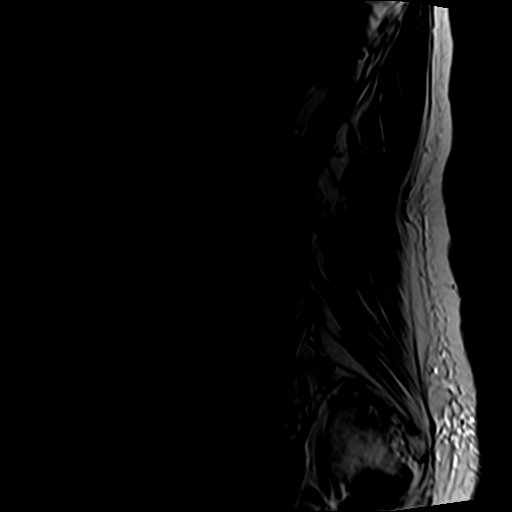

[Series 4: T1 · sagittal · 4.0mm · 0.53mm/px · 4 of 15 slices shown (1 of 2)]
[im 1/15]
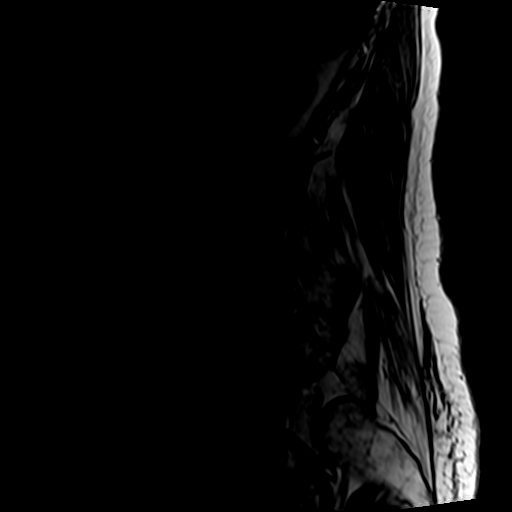
[im 5/15]
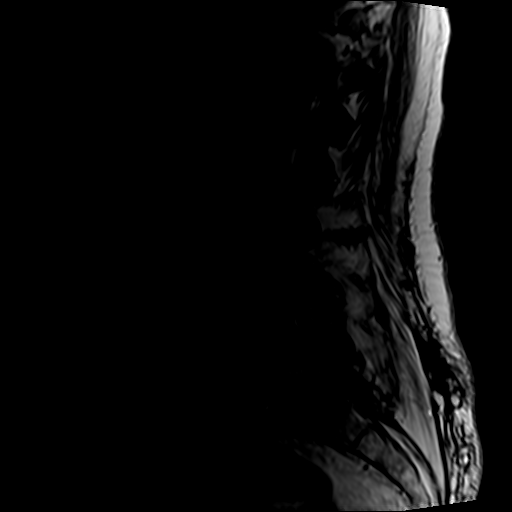
[im 10/15]
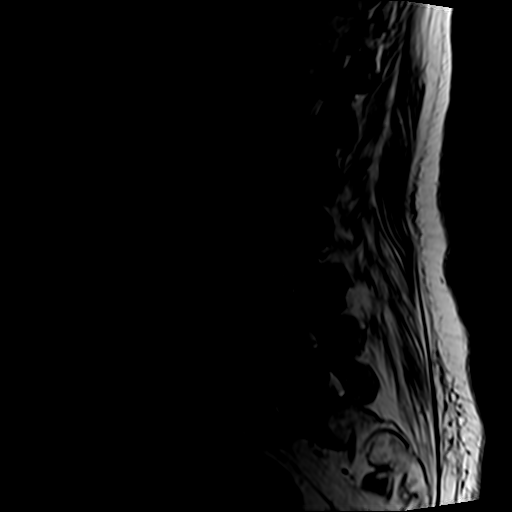
[im 15/15]
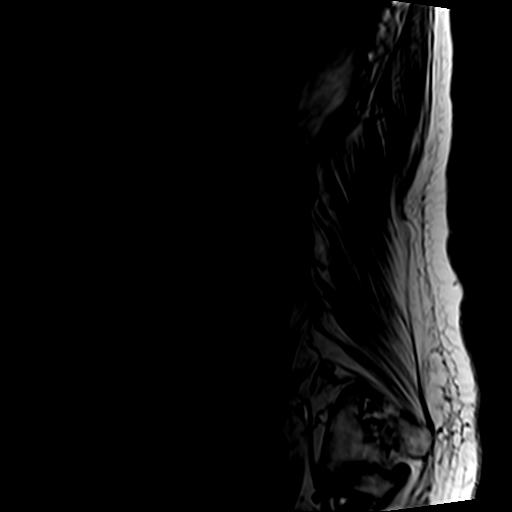

[Series 5: T2 · axial · 4.0mm · 0.70mm/px · z∈[-102,+116]mm · 11 of 36 slices shown (2 of 2)]
[im 1/36]
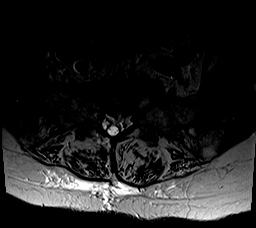
[im 4/36]
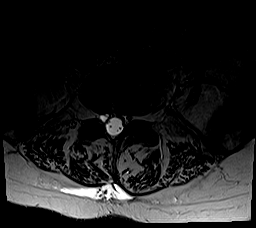
[im 8/36]
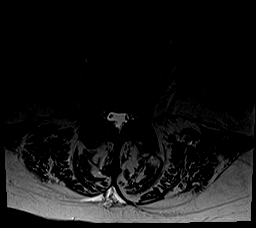
[im 11/36]
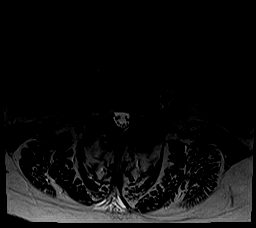
[im 15/36]
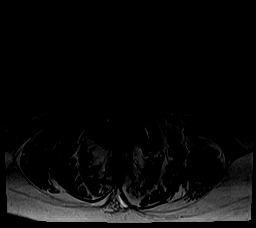
[im 18/36]
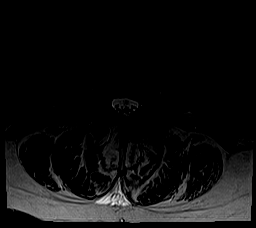
[im 22/36]
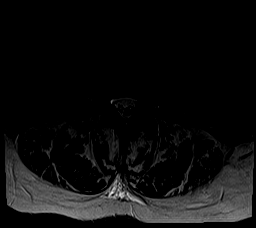
[im 25/36]
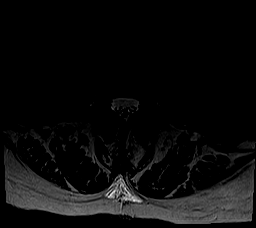
[im 29/36]
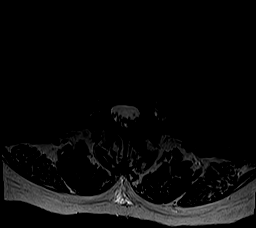
[im 32/36]
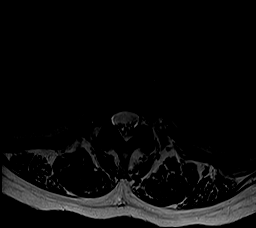
[im 36/36]
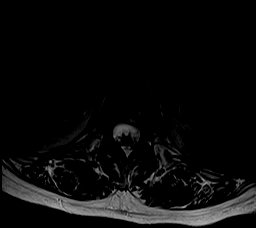

[Series 6: T1 · axial · 4.0mm · 0.35mm/px · z∈[-102,+95]mm · 10 of 36 slices shown (2 of 2)]
[im 1/36]
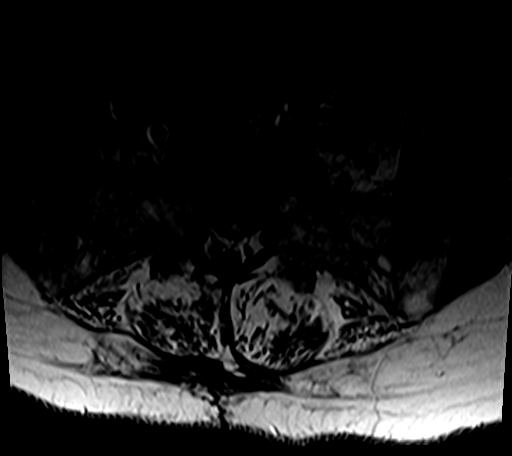
[im 4/36]
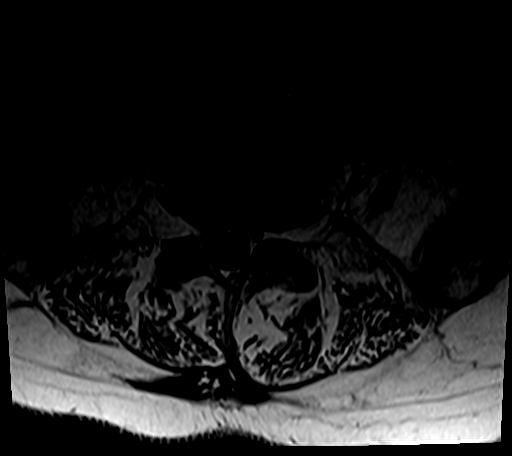
[im 8/36]
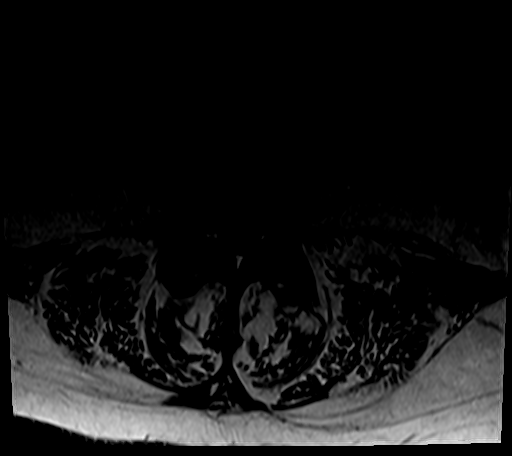
[im 11/36]
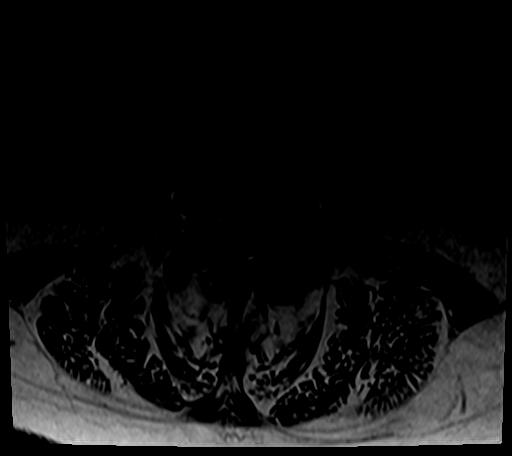
[im 15/36]
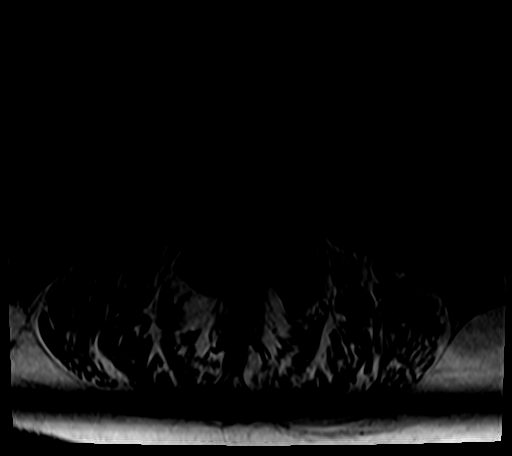
[im 18/36]
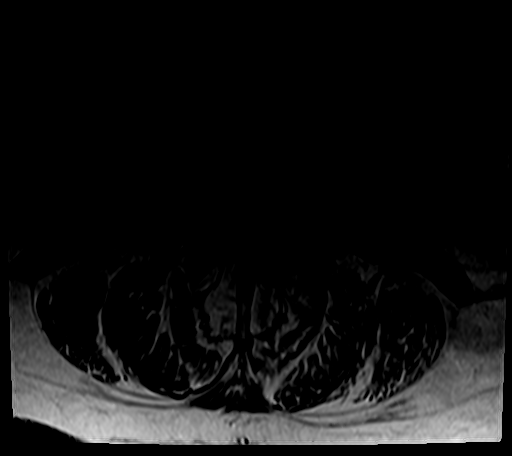
[im 22/36]
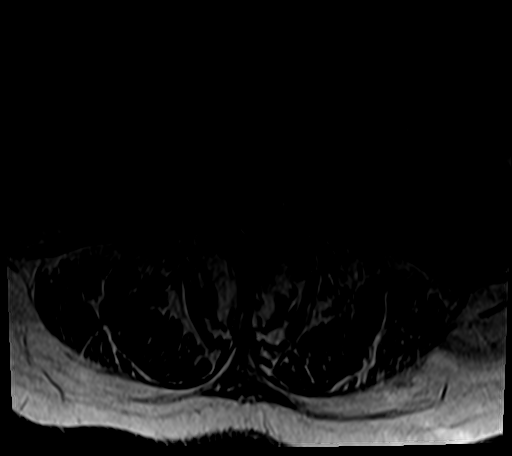
[im 25/36]
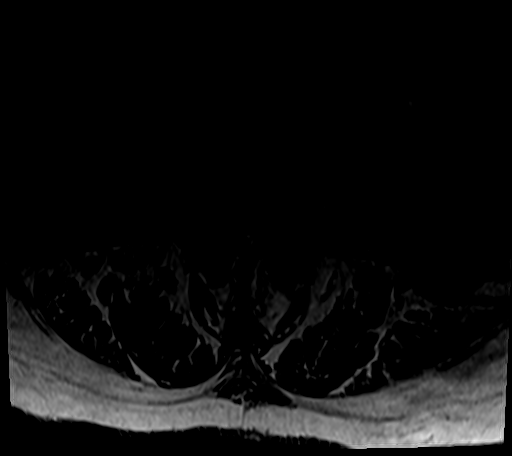
[im 29/36]
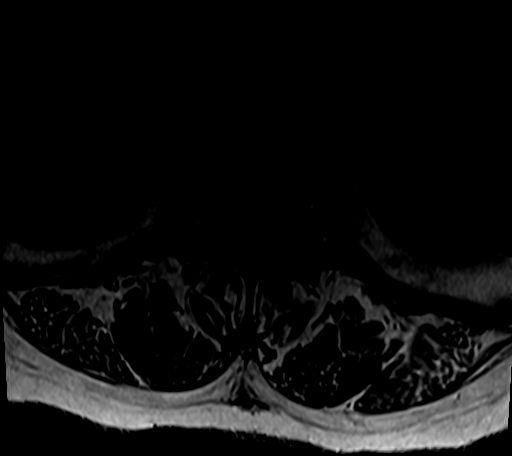
[im 32/36]
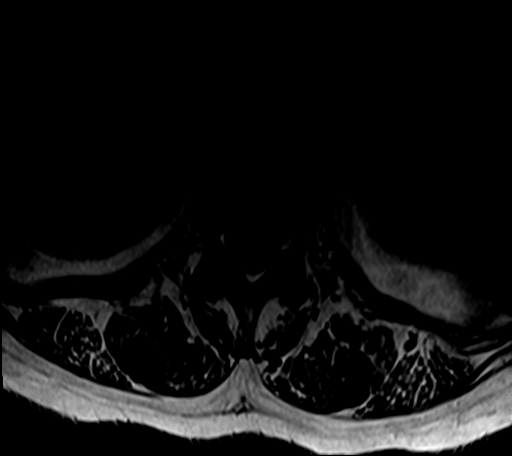

[28 of 48 positions shown; findings below may reference images not displayed]

FINDINGS: Segmentation:  Standard.

Alignment:  Physiologic.

Vertebrae:  No fracture, evidence of discitis, or bone lesion.

Conus medullaris and cauda equina: Conus extends to the L1 level.
Conus and cauda equina appear normal.

Paraspinal and other soft tissues: Negative.

Disc levels:

L1-L2: Small left asymmetric disc bulge with mild facet hypertrophy.
No spinal canal stenosis. No neural foraminal stenosis.

L2-L3: Moderate facet hypertrophy. No spinal canal stenosis. No
neural foraminal stenosis.

L3-L4: Severe facet hypertrophy and small disc bulge. No spinal
canal stenosis. No neural foraminal stenosis.

L4-L5: Moderate facet hypertrophy. Left lateral recess narrowing
without central spinal canal stenosis. No neural foraminal stenosis.

L5-S1: Normal disc space and facet joints. No spinal canal stenosis.
No neural foraminal stenosis.

Visualized sacrum: Normal.
IMPRESSION: 1. Moderate to severe multilevel facet arthrosis, which may be a
source of local low back pain.
2. Left lateral recess narrowing at L4-L[DATE] be a source of L5
radiculopathy.
3. No spinal canal or neural foraminal stenosis.

## 2022-03-27 ENCOUNTER — Other Ambulatory Visit (HOSPITAL_COMMUNITY): Payer: Self-pay

## 2022-03-27 MED ORDER — HYDROCODONE-ACETAMINOPHEN 10-325 MG PO TABS
ORAL_TABLET | ORAL | 0 refills | Status: DC
  Filled 2022-03-27 (×2): qty 180, 30d supply, fill #0

## 2022-04-28 ENCOUNTER — Other Ambulatory Visit (HOSPITAL_COMMUNITY): Payer: Self-pay

## 2022-04-28 ENCOUNTER — Other Ambulatory Visit (HOSPITAL_BASED_OUTPATIENT_CLINIC_OR_DEPARTMENT_OTHER): Payer: Self-pay

## 2022-04-28 MED ORDER — PAROXETINE HCL 20 MG PO TABS
20.0000 mg | ORAL_TABLET | Freq: Every day | ORAL | 1 refills | Status: DC
  Filled 2022-04-28: qty 30, 30d supply, fill #0

## 2022-04-28 MED ORDER — HYDROCODONE-ACETAMINOPHEN 10-325 MG PO TABS
1.0000 | ORAL_TABLET | ORAL | 0 refills | Status: DC | PRN
  Filled 2022-04-28: qty 180, 30d supply, fill #0

## 2022-04-28 MED ORDER — ALPRAZOLAM 0.5 MG PO TABS
0.5000 mg | ORAL_TABLET | Freq: Three times a day (TID) | ORAL | 0 refills | Status: DC | PRN
  Filled 2022-04-28: qty 90, 30d supply, fill #0

## 2022-05-27 ENCOUNTER — Other Ambulatory Visit (HOSPITAL_BASED_OUTPATIENT_CLINIC_OR_DEPARTMENT_OTHER): Payer: Self-pay

## 2022-05-27 MED ORDER — PAROXETINE HCL 20 MG PO TABS
20.0000 mg | ORAL_TABLET | Freq: Every day | ORAL | 1 refills | Status: DC
  Filled 2022-05-27: qty 30, 30d supply, fill #0

## 2022-05-27 MED ORDER — HYDROCODONE-ACETAMINOPHEN 10-325 MG PO TABS
1.0000 | ORAL_TABLET | ORAL | 0 refills | Status: DC
  Filled 2022-05-27: qty 180, 30d supply, fill #0

## 2022-06-26 ENCOUNTER — Other Ambulatory Visit (HOSPITAL_BASED_OUTPATIENT_CLINIC_OR_DEPARTMENT_OTHER): Payer: Self-pay

## 2022-06-26 MED ORDER — HYDROCODONE-ACETAMINOPHEN 10-325 MG PO TABS
1.0000 | ORAL_TABLET | ORAL | 0 refills | Status: DC | PRN
  Filled 2022-06-26: qty 72, 12d supply, fill #0
  Filled 2022-07-08: qty 108, 18d supply, fill #1

## 2022-06-26 MED ORDER — ALPRAZOLAM 0.5 MG PO TABS
0.5000 mg | ORAL_TABLET | Freq: Three times a day (TID) | ORAL | 1 refills | Status: DC | PRN
  Filled 2022-06-26: qty 90, 30d supply, fill #0

## 2022-06-27 ENCOUNTER — Other Ambulatory Visit (HOSPITAL_BASED_OUTPATIENT_CLINIC_OR_DEPARTMENT_OTHER): Payer: Self-pay

## 2022-06-27 MED ORDER — HYDROCODONE-ACETAMINOPHEN 10-325 MG PO TABS: 1.0000 | ORAL_TABLET | ORAL | 0 refills | Status: DC | PRN

## 2022-07-08 ENCOUNTER — Other Ambulatory Visit (HOSPITAL_BASED_OUTPATIENT_CLINIC_OR_DEPARTMENT_OTHER): Payer: Self-pay

## 2022-07-16 ENCOUNTER — Other Ambulatory Visit (HOSPITAL_BASED_OUTPATIENT_CLINIC_OR_DEPARTMENT_OTHER): Payer: Self-pay

## 2022-07-16 MED ORDER — LIDOCAINE 5 % EX PTCH
MEDICATED_PATCH | CUTANEOUS | 0 refills | Status: DC
  Filled 2022-07-16: qty 270, 90d supply, fill #0

## 2022-07-17 ENCOUNTER — Other Ambulatory Visit (HOSPITAL_BASED_OUTPATIENT_CLINIC_OR_DEPARTMENT_OTHER): Payer: Self-pay

## 2022-07-18 ENCOUNTER — Other Ambulatory Visit (HOSPITAL_BASED_OUTPATIENT_CLINIC_OR_DEPARTMENT_OTHER): Payer: Self-pay

## 2022-07-21 ENCOUNTER — Other Ambulatory Visit (HOSPITAL_BASED_OUTPATIENT_CLINIC_OR_DEPARTMENT_OTHER): Payer: Self-pay

## 2022-07-23 ENCOUNTER — Other Ambulatory Visit (HOSPITAL_BASED_OUTPATIENT_CLINIC_OR_DEPARTMENT_OTHER): Payer: Self-pay

## 2022-07-24 ENCOUNTER — Other Ambulatory Visit (HOSPITAL_BASED_OUTPATIENT_CLINIC_OR_DEPARTMENT_OTHER): Payer: Self-pay

## 2022-07-24 MED ORDER — PAROXETINE HCL 20 MG PO TABS
20.0000 mg | ORAL_TABLET | Freq: Every day | ORAL | 1 refills | Status: DC
  Filled 2022-07-24: qty 30, 30d supply, fill #0
  Filled 2022-08-25: qty 30, 30d supply, fill #1

## 2022-07-24 MED ORDER — HYDROCODONE-ACETAMINOPHEN 10-325 MG PO TABS
1.0000 | ORAL_TABLET | ORAL | 0 refills | Status: DC | PRN
  Filled 2022-07-24: qty 180, 30d supply, fill #0

## 2022-07-28 ENCOUNTER — Other Ambulatory Visit (HOSPITAL_BASED_OUTPATIENT_CLINIC_OR_DEPARTMENT_OTHER): Payer: Self-pay

## 2022-07-29 ENCOUNTER — Other Ambulatory Visit (HOSPITAL_BASED_OUTPATIENT_CLINIC_OR_DEPARTMENT_OTHER): Payer: Self-pay

## 2022-07-30 ENCOUNTER — Other Ambulatory Visit (HOSPITAL_BASED_OUTPATIENT_CLINIC_OR_DEPARTMENT_OTHER): Payer: Self-pay

## 2022-08-01 ENCOUNTER — Other Ambulatory Visit (HOSPITAL_BASED_OUTPATIENT_CLINIC_OR_DEPARTMENT_OTHER): Payer: Self-pay

## 2022-08-21 ENCOUNTER — Other Ambulatory Visit (HOSPITAL_BASED_OUTPATIENT_CLINIC_OR_DEPARTMENT_OTHER): Payer: Self-pay

## 2022-08-21 MED ORDER — HYDROCODONE-ACETAMINOPHEN 10-325 MG PO TABS
1.0000 | ORAL_TABLET | ORAL | 0 refills | Status: DC | PRN
  Filled 2022-08-21: qty 180, 30d supply, fill #0

## 2022-08-25 ENCOUNTER — Other Ambulatory Visit (HOSPITAL_BASED_OUTPATIENT_CLINIC_OR_DEPARTMENT_OTHER): Payer: Self-pay

## 2022-09-17 ENCOUNTER — Other Ambulatory Visit (HOSPITAL_BASED_OUTPATIENT_CLINIC_OR_DEPARTMENT_OTHER): Payer: Self-pay

## 2022-09-17 MED ORDER — PAROXETINE HCL 20 MG PO TABS
20.0000 mg | ORAL_TABLET | Freq: Every day | ORAL | 1 refills | Status: DC
  Filled 2022-09-17 – 2022-09-18 (×3): qty 30, 30d supply, fill #0

## 2022-09-17 MED ORDER — ALPRAZOLAM 0.5 MG PO TABS
0.5000 mg | ORAL_TABLET | Freq: Three times a day (TID) | ORAL | 1 refills | Status: DC | PRN
  Filled 2022-09-17: qty 90, 30d supply, fill #0

## 2022-09-17 MED ORDER — HYDROCODONE-ACETAMINOPHEN 10-325 MG PO TABS
1.0000 | ORAL_TABLET | ORAL | 0 refills | Status: DC | PRN
  Filled 2022-09-17 – 2022-09-23 (×2): qty 180, 30d supply, fill #0

## 2022-09-18 ENCOUNTER — Other Ambulatory Visit (HOSPITAL_BASED_OUTPATIENT_CLINIC_OR_DEPARTMENT_OTHER): Payer: Self-pay

## 2022-09-18 ENCOUNTER — Other Ambulatory Visit: Payer: Self-pay

## 2022-09-23 ENCOUNTER — Other Ambulatory Visit: Payer: Self-pay

## 2022-09-23 ENCOUNTER — Other Ambulatory Visit (HOSPITAL_BASED_OUTPATIENT_CLINIC_OR_DEPARTMENT_OTHER): Payer: Self-pay

## 2022-09-24 ENCOUNTER — Other Ambulatory Visit (HOSPITAL_BASED_OUTPATIENT_CLINIC_OR_DEPARTMENT_OTHER): Payer: Self-pay

## 2022-10-22 ENCOUNTER — Other Ambulatory Visit (HOSPITAL_BASED_OUTPATIENT_CLINIC_OR_DEPARTMENT_OTHER): Payer: Self-pay

## 2022-10-23 ENCOUNTER — Other Ambulatory Visit (HOSPITAL_BASED_OUTPATIENT_CLINIC_OR_DEPARTMENT_OTHER): Payer: Self-pay

## 2022-10-23 MED ORDER — NALOXONE HCL 4 MG/0.1ML NA LIQD
1.0000 | Freq: Once | NASAL | 0 refills | Status: AC
  Filled 2022-10-23: qty 2, 2d supply, fill #0

## 2022-10-23 MED ORDER — HYDROCODONE-ACETAMINOPHEN 10-325 MG PO TABS
1.0000 | ORAL_TABLET | ORAL | 0 refills | Status: DC | PRN
  Filled 2022-10-23: qty 180, 30d supply, fill #0

## 2022-11-20 ENCOUNTER — Other Ambulatory Visit (HOSPITAL_BASED_OUTPATIENT_CLINIC_OR_DEPARTMENT_OTHER): Payer: Self-pay

## 2022-11-20 MED ORDER — PAROXETINE HCL 20 MG PO TABS
20.0000 mg | ORAL_TABLET | Freq: Every day | ORAL | 1 refills | Status: DC
  Filled 2022-11-20: qty 30, 30d supply, fill #0

## 2022-11-20 MED ORDER — HYDROCODONE-ACETAMINOPHEN 10-325 MG PO TABS
1.0000 | ORAL_TABLET | ORAL | 0 refills | Status: DC | PRN
  Filled 2022-11-20: qty 180, 30d supply, fill #0

## 2022-11-20 MED ORDER — TIZANIDINE HCL 2 MG PO TABS
2.0000 mg | ORAL_TABLET | Freq: Every day | ORAL | 0 refills | Status: DC | PRN
  Filled 2022-11-20: qty 30, 30d supply, fill #0

## 2022-12-18 ENCOUNTER — Other Ambulatory Visit (HOSPITAL_BASED_OUTPATIENT_CLINIC_OR_DEPARTMENT_OTHER): Payer: Self-pay

## 2022-12-18 MED ORDER — ALPRAZOLAM 0.5 MG PO TABS
0.5000 mg | ORAL_TABLET | Freq: Three times a day (TID) | ORAL | 1 refills | Status: DC | PRN
  Filled 2022-12-18: qty 90, 30d supply, fill #0

## 2022-12-18 MED ORDER — HYDROCODONE-ACETAMINOPHEN 10-325 MG PO TABS
1.0000 | ORAL_TABLET | ORAL | 0 refills | Status: DC | PRN
  Filled 2022-12-18: qty 180, 30d supply, fill #0

## 2023-01-15 ENCOUNTER — Other Ambulatory Visit (HOSPITAL_BASED_OUTPATIENT_CLINIC_OR_DEPARTMENT_OTHER): Payer: Self-pay

## 2023-01-15 MED ORDER — HYDROCODONE-ACETAMINOPHEN 10-325 MG PO TABS
1.0000 | ORAL_TABLET | ORAL | 0 refills | Status: DC | PRN
  Filled 2023-01-15: qty 180, 30d supply, fill #0

## 2023-01-15 MED ORDER — PAROXETINE HCL 20 MG PO TABS
20.0000 mg | ORAL_TABLET | Freq: Every day | ORAL | 1 refills | Status: DC
  Filled 2023-01-15: qty 30, 30d supply, fill #0

## 2023-01-15 MED ORDER — TIZANIDINE HCL 2 MG PO TABS
2.0000 mg | ORAL_TABLET | Freq: Every day | ORAL | 0 refills | Status: DC | PRN
  Filled 2023-01-15: qty 30, 30d supply, fill #0

## 2023-02-12 ENCOUNTER — Other Ambulatory Visit (HOSPITAL_BASED_OUTPATIENT_CLINIC_OR_DEPARTMENT_OTHER): Payer: Self-pay

## 2023-02-12 MED ORDER — HYDROCODONE-ACETAMINOPHEN 10-325 MG PO TABS
1.0000 | ORAL_TABLET | ORAL | 0 refills | Status: DC | PRN
  Filled 2023-02-12: qty 180, 30d supply, fill #0

## 2023-02-12 MED ORDER — ALPRAZOLAM 0.5 MG PO TABS
0.5000 mg | ORAL_TABLET | Freq: Three times a day (TID) | ORAL | 1 refills | Status: DC | PRN
  Filled 2023-02-12: qty 90, 30d supply, fill #0

## 2023-02-20 ENCOUNTER — Ambulatory Visit
Admission: RE | Admit: 2023-02-20 | Discharge: 2023-02-20 | Disposition: A | Discharge status: Home / Self Care | Payer: Medicare Other | Source: Ambulatory Visit | Attending: Nurse Practitioner | Admitting: Nurse Practitioner

## 2023-02-20 VITALS — BP 149/87 | HR 93 | Temp 98.8°F | Resp 16

## 2023-02-20 DIAGNOSIS — J039 Acute tonsillitis, unspecified: Secondary | ICD-10-CM | POA: Diagnosis not present

## 2023-02-20 DIAGNOSIS — J029 Acute pharyngitis, unspecified: Secondary | ICD-10-CM | POA: Diagnosis not present

## 2023-02-20 LAB — POCT RAPID STREP A (OFFICE): Rapid Strep A Screen: NEGATIVE

## 2023-02-20 MED ORDER — AMOXICILLIN 875 MG PO TABS
875.0000 mg | ORAL_TABLET | Freq: Two times a day (BID) | ORAL | 0 refills | Status: AC
Start: 2023-02-20 — End: 2023-03-02

## 2023-02-20 NOTE — ED Triage Notes (Signed)
Pt presents to UC w/ c/o right sided sore throat extending to right ear x 3-4 days. Pt reports low grade fevers and nasal drainage "only on the left side." Mucinex doesn't help symptoms.

## 2023-02-20 NOTE — Discharge Instructions (Signed)
Start Amoxicillin for 10 days Continue Mucinex and Flonase as needed Rest and fluids Follow-up with your PCP if your symptoms do not improve Please go to the ER for any worsening symptoms

## 2023-02-20 NOTE — ED Provider Notes (Addendum)
[facility]    CSN: [id number] Arrival date & time: 02/20/23  1140      History   Chief Complaint Chief Complaint  Patient presents with   Sore Throat    HPI Michele [name] is a 72 y.o. female  presents for evaluation of URI symptoms for 4 days. Patient reports associated symptoms of right-sided sore throat with right ear pain, congestion, cough, and chills. Denies N/V/D, documented fevers, body aches, shortness of breath. Patient does not have a hx of asthma.  She is a previous smoker.  No known sick contacts.  Pt has taken Mucinex OTC for symptoms. Pt has no other concerns at this time.    Sore Throat    Past Medical History:  Diagnosis Date   Anxiety    Depression    Dysrhythmia    tachy episodes   Endometriosis    Herniated disc    L5-S1   History of kidney stones    History of orthostatic hypotension 08-31-13   History of phlebitis    from IV   Interstitial cystitis    Lumbar spondylosis    Multiple thyroid nodules    Peripheral vascular disease (HCC) 78   phlebitis lft arm- iv site   Phlebitis    Pneumonia    hx   PONV (postoperative nausea and vomiting)    last surgery okay   Shortness of breath    exersion   Spinal stenosis    Stroke (HCC) 5   tia   Tuberculosis    no tb but test pos   Unspecified disorder of adrenal glands    questionable problem to be investigated    Patient Active Problem List   Diagnosis Date Noted   Thyroid nodule 12/16/2013    Class: Chronic   Thyroid neoplasm 12/16/2013   Carpal tunnel syndrome 10/17/2013   Spinal stenosis in cervical region 10/17/2013   Cervicalgia 10/17/2013   Headache(784.0) 10/17/2013   Disturbance of skin sensation 10/17/2013   POTS (postural orthostatic tachycardia syndrome) 10/11/2013   PVC's (premature ventricular contractions) 10/11/2013   TIA (transient ischemic attack) 10/11/2013    Past Surgical History:  Procedure Laterality Date   CESAREAN SECTION  1985   LAMINOTOMY   2004   RIGHT INTERLAMINAR LAMINOTOMY, MICRODISKECTOMY, L5-S1    LITHOTRIPSY     NASAL SINUS SURGERY  1996   THYROIDECTOMY Right 12/16/2013   DR SHOEMAKER   THYROIDECTOMY Right 12/16/2013   Procedure: RIGHT THYROIDECTOMY;  Surgeon: Osborn Coho, MD;  Location: [facility] OR;  Service: ENT;  Laterality: Right;   TONSILLECTOMY AND ADENOIDECTOMY  1958   TOTAL ABDOMINAL HYSTERECTOMY W/ BILATERAL SALPINGOOPHORECTOMY  2004   VAGINAL BIRTH AFTER CESAREAN SECTION  1986    OB History   No obstetric history on file.      Home Medications    Prior to Admission medications   Medication Sig Start Date End Date Taking? Authorizing Provider  amoxicillin (AMOXIL) 875 MG tablet Take 1 tablet (875 mg total) by mouth 2 (two) times daily for 10 days. 02/20/23 03/02/23 Yes Radford Pax, NP  albuterol (VENTOLIN HFA) 108 (90 Base) MCG/ACT inhaler Inhale 1-2 puffs into the lungs every 6 (six) hours as needed for wheezing or shortness of breath. 04/12/21   Wieters, Hallie C, PA-C  ALPRAZolam (XANAX) 0.5 MG tablet Take 1 tablet by mouth daily as needed for anxiety.  09/07/13   [provider]  ALPRAZolam Prudy Feeler) 0.5 MG tablet Take 1 tablet (0.5 mg total) by  mouth 3 (three) times daily as needed. 06/26/22     ALPRAZolam (XANAX) 0.5 MG tablet Take 1 tablet (0.5 mg total) by mouth 3 (three) times daily as needed. 09/17/22     ALPRAZolam (XANAX) 0.5 MG tablet Take 1 tablet (0.5 mg total) by mouth 3 (three) times daily as needed. 12/18/22     ALPRAZolam (XANAX) 0.5 MG tablet Take 1 tablet (0.5 mg total) by mouth 3 (three) times daily as needed. 02/12/23     benzonatate (TESSALON) 100 MG capsule Take 1-2 capsules (100-200 mg total) by mouth every 8 (eight) hours. 04/12/21   Wieters, Hallie C, PA-C  fludrocortisone (FLORINEF) 0.1 MG tablet Take 0.1 mg by mouth daily. 10/11/13   Laurey Morale, MD  HYDROcodone-acetaminophen [facility]) 10-325 MG per tablet Take 1 tablet by mouth every 6 (six) hours as needed for moderate  pain.  09/11/13   [provider]  HYDROcodone-acetaminophen (NORCO) 10-325 MG tablet Take 1 tablet by mouth every four hours as needed for pain 03/27/22     HYDROcodone-acetaminophen (NORCO) 10-325 MG tablet Take 1 tablet by mouth every 4 (four) hours as needed for pain. 04/28/22     HYDROcodone-acetaminophen (NORCO) 10-325 MG tablet Take 1 tablet by mouth every four hours as needed for pain 05/27/22     HYDROcodone-acetaminophen (NORCO) 10-325 MG tablet Take 1 tablet by mouth every 4 (four) hours as needed. 06/26/22     HYDROcodone-acetaminophen (NORCO) 10-325 MG tablet Take 1 tablet by mouth every 4 (four) hours as needed for pain 06/27/22     HYDROcodone-acetaminophen (NORCO) 10-325 MG tablet Take 1 tablet by mouth every 4 (four) hours as needed for pain 07/24/22     HYDROcodone-acetaminophen (NORCO) 10-325 MG tablet Take 1 tablet by mouth every 4 (four) hours as needed for pain. 08/21/22     HYDROcodone-acetaminophen (NORCO) 10-325 MG tablet Take 1 tablet by mouth every 4 (four) hours as needed for pain. 10/23/22     HYDROcodone-acetaminophen (NORCO) 10-325 MG tablet Take 1 tablet by mouth every 4 (four) hours as needed. 11/20/22     HYDROcodone-acetaminophen (NORCO) 10-325 MG tablet Take 1 tablet by mouth every 4 (four) hours as needed for pain. 02/12/23     lidocaine (LIDODERM) 5 % Apply 1-3 patches on the skin as directed; 12 hours on and 12 hours off. 07/16/22     metoprolol tartrate (LOPRESSOR) 25 MG tablet 1 and 1/2 tablet twice a day 11/18/13   Laurey Morale, MD  PARoxetine (PAXIL) 20 MG tablet Take 1 tablet (20 mg total) by mouth daily. 04/28/22     PARoxetine (PAXIL) 20 MG tablet Take 1 tablet by mouth once a day 05/27/22     PARoxetine (PAXIL) 20 MG tablet Take 1 tablet (20 mg total) by mouth daily. 07/24/22     PARoxetine (PAXIL) 20 MG tablet Take 1 tablet (20 mg total) by mouth daily. 09/17/22     PARoxetine (PAXIL) 20 MG tablet Take 1 tablet (20 mg total) by mouth daily. 11/20/22      PARoxetine (PAXIL) 20 MG tablet Take 1 tablet (20 mg total) by mouth daily. 01/15/23     tiZANidine (ZANAFLEX) 2 MG tablet Take 1 tablet (2 mg total) by mouth daily as needed. 01/15/23     topiramate (TOPAMAX) 50 MG tablet Take 1 tablet (50 mg total) by mouth 2 (two) times daily. 01/12/14   Micki Riley, MD  Vitamin D, Ergocalciferol, (DRISDOL) 50000 UNITS CAPS capsule  01/04/14   [provider]    Family History Family History  Problem Relation Age of Onset   Hypertension Mother    Heart disease Mother    Anxiety disorder Mother    Arthritis Mother    Cervical cancer Mother    Prostate cancer Father    Hypertension Father    Heart disease Father    Breast cancer Sister        premenopausal   Hepatitis C Sister    Fibromyalgia Sister    Hypertension Sister    Heart disease Sister    Hemochromatosis Brother    Hypothyroidism Father    Gout Father    Goiter Mother    Hypertension Paternal Grandfather    CVA Paternal Grandfather 58   Heart attack Paternal Grandfather    Heart attack Paternal Grandmother    CVA Paternal Grandmother 41   Hyperlipidemia Paternal Grandmother    Hypertension Paternal Grandmother    Hypertension Maternal Grandfather    Heart attack Maternal Grandfather 76   Hyperlipidemia Maternal Grandfather    Hypertension Maternal Grandmother    Heart attack Maternal Grandmother    Hyperlipidemia Maternal Grandmother    Heart attack Sister 23    Social History Social History   Tobacco Use   Smoking status: Former    Packs/day: 1.00    Years: 25.00    Additional pack years: 0.00    Total pack years: 25.00    Types: Cigarettes    Quit date: 04/13/2012    Years since quitting: 10.8   Smokeless tobacco: Never   Tobacco comments:    quit july 2013  Substance Use Topics   Alcohol use: No   Drug use: No     Allergies   Iodine, Latex, and Shellfish allergy   Review of Systems Review of Systems  HENT:  Positive for congestion, ear pain  and sore throat.   Respiratory:  Positive for cough.      Physical Exam Triage Vital Signs ED Triage Vitals  Enc Vitals Group     BP 02/20/23 1148 (!) 149/87     Pulse Rate 02/20/23 1148 93     Resp 02/20/23 1148 16     Temp 02/20/23 1148 98.8 F (37.1 C)     Temp Source 02/20/23 1148 Oral     SpO2 02/20/23 1148 95 %     Weight --      Height --      Head Circumference --      Peak Flow --      Pain Score 02/20/23 1152 2     Pain Loc --      Pain Edu? --      Excl. in GC? --    No data found.  Updated Vital Signs BP (!) 149/87 (BP Location: Left Arm)   Pulse 93   Temp 98.8 F (37.1 C) (Oral)   Resp 16   SpO2 95%   Visual Acuity Right Eye Distance:   Left Eye Distance:   Bilateral Distance:    Right Eye Near:   Left Eye Near:    Bilateral Near:     Physical Exam Vitals and nursing note reviewed.  Constitutional:      General: She is not in acute distress.    Appearance: Normal appearance. She is not ill-appearing or toxic-appearing.  HENT:     Head: Normocephalic and atraumatic.     Right Ear: Ear canal normal. A middle ear effusion is present.     Left Ear:  Tympanic membrane and ear canal normal.     Nose: Congestion present.     Mouth/Throat:     Mouth: Mucous membranes are moist.     Pharynx: Oropharynx is clear. Uvula midline. Posterior oropharyngeal erythema present. No pharyngeal swelling, oropharyngeal exudate or uvula swelling.     Tonsils: No tonsillar exudate or tonsillar abscesses.  Eyes:     Extraocular Movements: Extraocular movements intact.     Conjunctiva/sclera: Conjunctivae normal.     Pupils: Pupils are equal, round, and reactive to light.  Cardiovascular:     Rate and Rhythm: Normal rate and regular rhythm.     Heart sounds: Normal heart sounds.  Pulmonary:     Effort: Pulmonary effort is normal.     Breath sounds: Normal breath sounds.  Musculoskeletal:     Cervical back: Neck supple. No rigidity.  Lymphadenopathy:      Cervical: Cervical adenopathy present.  Skin:    General: Skin is warm and dry.  Neurological:     General: No focal deficit present.     Mental Status: She is alert and oriented to person, place, and time.  Psychiatric:        Mood and Affect: Mood normal.        Behavior: Behavior normal.      UC Treatments / Results  Labs (all labs ordered are listed, but only abnormal results are displayed) Labs Reviewed  POCT RAPID STREP A (OFFICE)   Basic metabolic panel Order: [id number] Status: Final result     Visible to patient: Yes (seen)     Next appt: 03/11/2023 at 11:00 AM in Family Medicine Clayborne Dana, NP)     Dx: COVID-19 virus infection   1 Result Note     Component Ref Range & Units 1 yr ago 9 yr ago  Glucose 65 - 99 mg/dL 95 409 High  R  BUN 8 - 27 mg/dL 6 Low  13 R  Creatinine, Ser 0.57 - 1.00 mg/dL 8.11 9.14 R  eGFR >78 GN/FAO/1.30 83   BUN/Creatinine Ratio 12 - 28 8 Low    Sodium 134 - 144 mmol/L 139 141 R  Potassium 3.5 - 5.2 mmol/L 4.3 4.0 R  Chloride 96 - 106 mmol/L 102 105 R  CO2 20 - 29 mmol/L 22 24 R  Calcium 8.7 - 10.3 mg/dL 9.1 9.2 R  Resulting Agency [facility]         Narrative Performed by: Verdell Carmine Performed at:  [address] - [facility] [address], [place], [place]  [phone]       EKG   Radiology No results found.  Procedures Procedures (including critical care time)  Medications Ordered in UC Medications - No data to display  Initial Impression / Assessment and Plan / UC Course  I have reviewed the triage vital signs and the nursing notes.  Pertinent labs & imaging results that were available during my care of the patient were reviewed by me and considered in my medical decision making (see chart for details).     Reviewed exam and symptoms with patient.  No red flags Negative rapid strep Restart amoxicillin as given presentation She will continue Mucinex and start OTC Flonase PCP follow-up if  symptoms do not improve ER precautions reviewed and patient verbalized understanding Final Clinical Impressions(s) / UC Diagnoses   Final diagnoses:  Sore throat  Acute tonsillitis, unspecified etiology     Discharge Instructions      Start Amoxicillin for 10  days Continue Mucinex and Flonase as needed Rest and fluids Follow-up with your PCP if your symptoms do not improve Please go to the ER for any worsening symptoms     ED Prescriptions     Medication Sig Dispense Auth. Provider   amoxicillin (AMOXIL) 875 MG tablet Take 1 tablet (875 mg total) by mouth 2 (two) times daily for 10 days. 20 tablet Radford Pax, NP      PDMP not reviewed this encounter.   Radford Pax, NP 02/20/23 1216    Radford Pax, NP 02/20/23 [phone]

## 2023-03-11 ENCOUNTER — Encounter: Payer: Medicare Other | Admitting: Family Medicine

## 2023-03-12 NOTE — Progress Notes (Signed)
Cancelled. This encounter was created in error - please disregard. 

## 2023-03-17 ENCOUNTER — Other Ambulatory Visit (HOSPITAL_BASED_OUTPATIENT_CLINIC_OR_DEPARTMENT_OTHER): Payer: Self-pay

## 2023-03-17 MED ORDER — PREDNISONE 10 MG PO TABS
ORAL_TABLET | ORAL | 0 refills | Status: DC
  Filled 2023-03-20: qty 30, 10d supply, fill #0

## 2023-03-17 MED ORDER — HYDROCODONE-ACETAMINOPHEN 10-325 MG PO TABS
ORAL_TABLET | ORAL | 0 refills | Status: DC
  Filled 2023-03-17: qty 180, 30d supply, fill #0

## 2023-03-17 MED ORDER — PAROXETINE HCL 20 MG PO TABS
20.0000 mg | ORAL_TABLET | Freq: Every day | ORAL | 1 refills | Status: DC
  Filled 2023-03-17 – 2023-03-20 (×2): qty 30, 30d supply, fill #0

## 2023-03-18 ENCOUNTER — Other Ambulatory Visit (HOSPITAL_BASED_OUTPATIENT_CLINIC_OR_DEPARTMENT_OTHER): Payer: Self-pay

## 2023-03-20 ENCOUNTER — Other Ambulatory Visit (HOSPITAL_BASED_OUTPATIENT_CLINIC_OR_DEPARTMENT_OTHER): Payer: Self-pay

## 2023-03-30 ENCOUNTER — Encounter: Payer: Self-pay | Admitting: Cardiology

## 2023-03-30 ENCOUNTER — Ambulatory Visit (INDEPENDENT_AMBULATORY_CARE_PROVIDER_SITE_OTHER): Payer: Medicare Other

## 2023-03-30 ENCOUNTER — Telehealth: Payer: Self-pay | Admitting: Home Health

## 2023-03-30 ENCOUNTER — Ambulatory Visit: Payer: Medicare Other | Attending: Cardiology | Admitting: Cardiology

## 2023-03-30 ENCOUNTER — Other Ambulatory Visit: Payer: Self-pay | Admitting: Home Health

## 2023-03-30 VITALS — BP 122/84 | HR 78 | Ht 63.0 in | Wt 138.6 lb

## 2023-03-30 DIAGNOSIS — R7303 Prediabetes: Secondary | ICD-10-CM | POA: Diagnosis present

## 2023-03-30 DIAGNOSIS — Z01812 Encounter for preprocedural laboratory examination: Secondary | ICD-10-CM | POA: Diagnosis present

## 2023-03-30 DIAGNOSIS — I493 Ventricular premature depolarization: Secondary | ICD-10-CM | POA: Insufficient documentation

## 2023-03-30 DIAGNOSIS — R0609 Other forms of dyspnea: Secondary | ICD-10-CM | POA: Insufficient documentation

## 2023-03-30 DIAGNOSIS — R072 Precordial pain: Secondary | ICD-10-CM | POA: Insufficient documentation

## 2023-03-30 DIAGNOSIS — E875 Hyperkalemia: Secondary | ICD-10-CM

## 2023-03-30 DIAGNOSIS — E785 Hyperlipidemia, unspecified: Secondary | ICD-10-CM | POA: Insufficient documentation

## 2023-03-30 LAB — COMPREHENSIVE METABOLIC PANEL
ALT: 8 IU/L (ref 0–32)
AST: 14 IU/L (ref 0–40)
Albumin: 4.5 g/dL (ref 3.8–4.8)
Alkaline Phosphatase: 75 IU/L (ref 44–121)
BUN/Creatinine Ratio: 11 — ABNORMAL LOW (ref 12–28)
BUN: 10 mg/dL (ref 8–27)
Bilirubin Total: 0.3 mg/dL (ref 0.0–1.2)
CO2: 28 mmol/L (ref 20–29)
Calcium: 9.7 mg/dL (ref 8.7–10.3)
Chloride: 102 mmol/L (ref 96–106)
Creatinine, Ser: 0.92 mg/dL (ref 0.57–1.00)
Globulin, Total: 2.4 g/dL (ref 1.5–4.5)
Glucose: 94 mg/dL (ref 70–99)
Potassium: 5.9 mmol/L (ref 3.5–5.2)
Sodium: 140 mmol/L (ref 134–144)
Total Protein: 6.9 g/dL (ref 6.0–8.5)
eGFR: 67 mL/min/{1.73_m2} (ref >59)

## 2023-03-30 LAB — LIPID PANEL
Chol/HDL Ratio: 2.9 ratio (ref 0.0–4.4)
Cholesterol, Total: 170 mg/dL (ref 100–199)
HDL: 59 mg/dL (ref >39)
LDL Chol Calc (NIH): 98 mg/dL (ref 0–99)
Triglycerides: 69 mg/dL (ref 0–149)
VLDL Cholesterol Cal: 13 mg/dL (ref 5–40)

## 2023-03-30 LAB — TSH+T4F+T3FREE
Free T4: 1.14 ng/dL (ref 0.82–1.77)
T3, Free: 3.5 pg/mL (ref 2.0–4.4)
TSH: 2.85 u[IU]/mL (ref 0.450–4.500)

## 2023-03-30 LAB — MAGNESIUM: Magnesium: 2.1 mg/dL (ref 1.6–2.3)

## 2023-03-30 LAB — HEMOGLOBIN A1C
Est. average glucose Bld gHb Est-mCnc: 123 mg/dL
Hgb A1c MFr Bld: 5.9 % — ABNORMAL HIGH (ref 4.8–5.6)

## 2023-03-30 MED ORDER — METOPROLOL TARTRATE 100 MG PO TABS: ORAL_TABLET | ORAL | 0 refills | Status: DC

## 2023-03-30 MED ORDER — PREDNISONE 50 MG PO TABS: ORAL_TABLET | ORAL | 0 refills | Status: DC

## 2023-03-30 MED ORDER — LOKELMA 10 G PO PACK: 10.0000 g | PACK | Freq: Once | ORAL | 0 refills | Status: AC

## 2023-03-30 NOTE — Progress Notes (Signed)
Cardiology Office Note:    Date:  03/30/2023   ID:  Michele [name], Michele [name] 02/22/1951, MRN [id number]  PCP:  Noberto Retort, MD  Cardiologist:  Thomasene Ripple, DO  Electrophysiologist:  None   Referring MD: Noberto Retort, MD     History of Present Illness:    Michele [name] is a 72 y.o. female with a hx of hypertension, hyperlipidemia, history of partial orthostatic tachycardia syndrome, history of PVCs here today to establish cardiac care. The patient was followed by [facility] cardiology her last visit was November 28, 2021 at that time it was reported that she had PVC burden of almost 4%.  She was started on low-dose metoprolol.  She tells me that she took that medication for 6 months and has stopped it.  Is no longer on the metoprolol.  She does tell me she has had some palpitations and shortness of breath.  No chest discomfort.  She reports that she is experiencing shortness of breath on minimal exertion.  She is concerned about this.  Per care everywhere previous cardiovascular workup shows stress test with a Lexiscan in 2022 which was normal.  Echocardiogram in 2023 which showed normal EF with mild concentric hypertrophy.  Trace mitral regurgitation.  Report from Holter done in January 2023 showed normal heart rate trends with variability, tachycardia burden was 8%, bradycardia burden was less than 1%.   Past Medical History:  Diagnosis Date   Anxiety    Depression    Dysrhythmia    tachy episodes   Endometriosis    Herniated disc    L5-S1   History of kidney stones    History of orthostatic hypotension 08-31-13   History of phlebitis    from IV   Interstitial cystitis    Lumbar spondylosis    Multiple thyroid nodules    Peripheral vascular disease (HCC) 78   phlebitis lft arm- iv site   Phlebitis    Pneumonia    hx   PONV (postoperative nausea and vomiting)    last surgery okay   Shortness of breath    exersion   Spinal stenosis    Stroke (HCC) 5   tia    Tuberculosis    no tb but test pos   Unspecified disorder of adrenal glands    questionable problem to be investigated    Past Surgical History:  Procedure Laterality Date   CESAREAN SECTION  1985   LAMINOTOMY  2004   RIGHT INTERLAMINAR LAMINOTOMY, MICRODISKECTOMY, L5-S1    LITHOTRIPSY     NASAL SINUS SURGERY  1996   THYROIDECTOMY Right 12/16/2013   DR SHOEMAKER   THYROIDECTOMY Right 12/16/2013   Procedure: RIGHT THYROIDECTOMY;  Surgeon: Osborn Coho, MD;  Location: [facility] OR;  Service: ENT;  Laterality: Right;   TONSILLECTOMY AND ADENOIDECTOMY  1958   TOTAL ABDOMINAL HYSTERECTOMY W/ BILATERAL SALPINGOOPHORECTOMY  2004   VAGINAL BIRTH AFTER CESAREAN SECTION  1986    Current Medications: Current Meds  Medication Sig   ALPRAZolam (XANAX) 0.5 MG tablet Take 1 tablet (0.5 mg total) by mouth 3 (three) times daily as needed.   fludrocortisone (FLORINEF) 0.1 MG tablet Take 0.1 mg by mouth daily.   HYDROcodone-acetaminophen (NORCO) 10-325 MG per tablet Take 1 tablet by mouth every 6 (six) hours as needed for moderate pain.    metoprolol tartrate (LOPRESSOR) 100 MG tablet Take 2 hours prior to CT   PARoxetine (PAXIL) 20 MG tablet Take 1 tablet (20 mg total) by mouth  daily.   tiZANidine (ZANAFLEX) 2 MG tablet Take 1 tablet (2 mg total) by mouth daily as needed.   topiramate (TOPAMAX) 50 MG tablet Take 1 tablet (50 mg total) by mouth 2 (two) times daily.   [DISCONTINUED] predniSONE (DELTASONE) 50 MG tablet Take 50 mg (one tablet) 13 hours before procedure. Take 50 mg (one tablet) 7 house before procedure. Take 50 mg (one tablet) before leaving home before procedure.     Allergies:   Iodine, Latex, and Shellfish allergy   Social History   Socioeconomic History   Marital status: Widowed    Spouse name: Not on file   Number of children: 2   Years of education: college2   Highest education level: Not on file  Occupational History   Occupation: retired  Tobacco Use   Smoking status:  Former    Packs/day: 1.00    Years: 25.00    Additional pack years: 0.00    Total pack years: 25.00    Types: Cigarettes    Quit date: 04/13/2012    Years since quitting: 10.9   Smokeless tobacco: Never   Tobacco comments:    quit july 2013  Substance and Sexual Activity   Alcohol use: No   Drug use: No   Sexual activity: Not on file  Other Topics Concern   Not on file  Social History Narrative   Not on file   Social Determinants of Health   Financial Resource Strain: Not on file  Food Insecurity: Not on file  Transportation Needs: Not on file  Physical Activity: Not on file  Stress: Not on file  Social Connections: Not on file     Family History: The patient's family history includes Anxiety disorder in her mother; Arthritis in her mother; Breast cancer in her sister; CVA (age of onset: 83) in her paternal grandfather; CVA (age of onset: 57) in her paternal grandmother; Cervical cancer in her mother; Fibromyalgia in her sister; Goiter in her mother; Gout in her father; Heart attack in her maternal grandmother, paternal grandfather, and paternal grandmother; Heart attack (age of onset: 34) in her sister; Heart attack (age of onset: 67) in her maternal grandfather; Heart disease in her father, mother, and sister; Hemochromatosis in her brother; Hepatitis C in her sister; Hyperlipidemia in her maternal grandfather, maternal grandmother, and paternal grandmother; Hypertension in her father, maternal grandfather, maternal grandmother, mother, paternal grandfather, paternal grandmother, and sister; Hypothyroidism in her father; Prostate cancer in her father.  ROS:   Review of Systems  Constitution: Negative for decreased appetite, fever and weight gain.  HENT: Negative for congestion, ear discharge, hoarse voice and sore throat.   Eyes: Negative for discharge, redness, vision loss in right eye and visual halos.  Cardiovascular: Negative for chest pain, dyspnea on exertion, leg  swelling, orthopnea and palpitations.  Respiratory: Negative for cough, hemoptysis, shortness of breath and snoring.   Endocrine: Negative for heat intolerance and polyphagia.  Hematologic/Lymphatic: Negative for bleeding problem. Does not bruise/bleed easily.  Skin: Negative for flushing, nail changes, rash and suspicious lesions.  Musculoskeletal: Negative for arthritis, joint pain, muscle cramps, myalgias, neck pain and stiffness.  Gastrointestinal: Negative for abdominal pain, bowel incontinence, diarrhea and excessive appetite.  Genitourinary: Negative for decreased libido, genital sores and incomplete emptying.  Neurological: Negative for brief paralysis, focal weakness, headaches and loss of balance.  Psychiatric/Behavioral: Negative for altered mental status, depression and suicidal ideas.  Allergic/Immunologic: Negative for HIV exposure and persistent infections.    EKGs/Labs/Other Studies Reviewed:  The following studies were reviewed today:   EKG:  The ekg ordered today demonstrates sinus rhythm with T wave abnormalities in the inferior and anterolateral leads.  Recent Labs: No results found for requested labs within last 365 days.  Recent Lipid Panel No results found for: "CHOL", "TRIG", "HDL", "CHOLHDL", "VLDL", "LDLCALC", "LDLDIRECT"  Physical Exam:    VS:  BP 122/84   Pulse 78   Ht 5\' 3"  (1.6 m)   Wt 138 lb 9.6 oz (62.9 kg)   SpO2 96%   BMI 24.55 kg/m     Wt Readings from Last 3 Encounters:  03/30/23 138 lb 9.6 oz (62.9 kg)  04/12/21 140 lb (63.5 kg)  01/12/14 126 lb (57.2 kg)     GEN: Well nourished, well developed in no acute distress HEENT: Normal NECK: No JVD; No carotid bruits LYMPHATICS: No lymphadenopathy CARDIAC: S1S2 noted,RRR, no murmurs, rubs, gallops RESPIRATORY:  Clear to auscultation without rales, wheezing or rhonchi  ABDOMEN: Soft, non-tender, non-distended, +bowel sounds, no guarding. EXTREMITIES: No edema, No cyanosis, no  clubbing MUSCULOSKELETAL:  No deformity  SKIN: Warm and dry NEUROLOGIC:  Alert and oriented x 3, non-focal PSYCHIATRIC:  Normal affect, good insight  ASSESSMENT:    1. PVC's (premature ventricular contractions)   2. DOE (dyspnea on exertion)   3. Pre-procedure lab exam   4. Prediabetes   5. Hyperlipidemia LDL goal <100   6. Precordial pain    PLAN:    Shortness of breath is concerning,  this patient does have intermediate risk for coronary artery disease and at this time I would like to pursue an ischemic evaluation in this patient.  Shared decision a coronary CTA at this time is appropriate.  I have discussed with the patient about the testing.  The patient has no IV contrast allergy and is agreeable to proceed with this test.  She needs her repeat monitor she is no longer on beta-blocker we will like to understand her PVC burden and make sure that she is not going into atrial fibrillation as she tells me that her mobile has been noting irregular rhythm questionable A-fib.  She does not have any other strips here with member.   Blood pressure is acceptable in the office today.   Get blood work that will include CMP, mag, TSH, hemoglobin A1c as well as lipid profile.  The patient is in agreement with the above plan. The patient left the office in stable condition.  The patient will follow up in   Medication Adjustments/Labs and Tests Ordered: Current medicines are reviewed at length with the patient today.  Concerns regarding medicines are outlined above.  Orders Placed This Encounter  Procedures   CT CORONARY MORPH W/CTA COR W/SCORE W/CA W/CM &/OR WO/CM   Comprehensive Metabolic Panel (CMET)   Magnesium   Lipid panel   TSH+T4F+T3Free   Hemoglobin A1c   LONG TERM MONITOR (3-14 DAYS)   EKG 12-Lead   Meds ordered this encounter  Medications   metoprolol tartrate (LOPRESSOR) 100 MG tablet    Sig: Take 2 hours prior to CT    Dispense:  1 tablet    Refill:  0   DISCONTD:  predniSONE (DELTASONE) 50 MG tablet    Sig: Take 50 mg (one tablet) 13 hours before procedure. Take 50 mg (one tablet) 7 house before procedure. Take 50 mg (one tablet) before leaving home before procedure.    Dispense:  3 tablet    Refill:  0    Patient Instructions  Medication Instructions:  Your physician recommends that you continue on your current medications as directed. Please refer to the Current Medication list given to you today.  *If you need a refill on your cardiac medications before your next appointment, please call your pharmacy*   Lab Work: Your physician recommends that you have labs drawn today: CMET, Mag, Lipids,TSH, HgbA1c If you have labs (blood work) drawn today and your tests are completely normal, you will receive your results only by: MyChart Message (if you have MyChart) OR A paper copy in the mail If you have any lab test that is abnormal or we need to change your treatment, we will call you to review the results.   Testing/Procedures: Christena Deem- Long Term Monitor Instructions  Your physician has requested you wear a ZIO patch monitor for 14 days.  This is a single patch monitor. [employer] supplies one patch monitor per enrollment. Additional stickers are not available. Please do not apply patch if you will be having a Nuclear Stress Test,  Echocardiogram, Cardiac CT, MRI, or Chest Xray during the period you would be wearing the  monitor. The patch cannot be worn during these tests. You cannot remove and re-apply the  ZIO XT patch monitor.  Your ZIO patch monitor will be mailed 3 day USPS to your address on file. It may take 3-5 days  to receive your monitor after you have been enrolled.  Once you have received your monitor, please review the enclosed instructions. Your monitor  has already been registered assigning a specific monitor serial # to you.  Billing and Patient Assistance Program Information  We have supplied [employer] with any of your insurance  information on file for billing purposes. [employer] offers a sliding scale Patient Assistance Program for patients that do not have  insurance, or whose insurance does not completely cover the cost of the ZIO monitor.  You must apply for the Patient Assistance Program to qualify for this discounted rate.  To apply, please call [employer] at [phone], select option 4, select option 2, ask to apply for  Patient Assistance Program. Meredeth Ide will ask your household income, and how many people  are in your household. They will quote your out-of-pocket cost based on that information.  [employer] will also be able to set up a 4-month, interest-free payment plan if needed.  Applying the monitor   Shave hair from upper left chest.  Hold abrader disc by orange tab. Rub abrader in 40 strokes over the upper left chest as  indicated in your monitor instructions.  Clean area with 4 enclosed alcohol pads. Let dry.  Apply patch as indicated in monitor instructions. Patch will be placed under collarbone on left  side of chest with arrow pointing upward.  Rub patch adhesive wings for 2 minutes. Remove white label marked "1". Remove the white  label marked "2". Rub patch adhesive wings for 2 additional minutes.  While looking in a mirror, press and release button in center of patch. A small green light will  flash 3-4 times. This will be your only indicator that the monitor has been turned on.  Do not shower for the first 24 hours. You may shower after the first 24 hours.  Press the button if you feel a symptom. You will hear a small click. Record Date, Time and  Symptom in the Patient Logbook.  When you are ready to remove the patch, follow instructions on the last 2 pages of Patient  Logbook. Stick patch monitor  onto the last page of Patient Logbook.  Place Patient Logbook in the blue and white box. Use locking tab on box and tape box closed  securely. The blue and white box has prepaid postage on it. Please  place it in the mailbox as  soon as possible. Your physician should have your test results approximately 7 days after the  monitor has been mailed back to [facility].  Call [facility] Customer Care at [phone] if you have questions regarding  your ZIO XT patch monitor. Call them immediately if you see an orange light blinking on your  monitor.  If your monitor falls off in less than 4 days, contact our Monitor department at [phone].  If your monitor becomes loose or falls off after 4 days call [employer] at [phone] for  suggestions on securing your monitor    Your cardiac CT will be scheduled at one of the below locations:   [facility] [address] [place], [place] [address] [phone]   If scheduled at [facility], please arrive at the [facility] and Children's Entrance (Entrance C2) of [facility] 30 minutes prior to test start time. You can use the FREE valet parking offered at entrance C (encouraged to control the heart rate for the test)  Proceed to the [facility] Radiology Department (first floor) to check-in and test prep.  All radiology patients and guests should use entrance C2 at [facility], accessed from [facility], even though the hospital's physical address listed is [address].    Please follow these instructions carefully (unless otherwise directed):   On the Night Before the Test: Be sure to Drink plenty of water. Do not consume any caffeinated/decaffeinated beverages or chocolate 12 hours prior to your test. Do not take any antihistamines 12 hours prior to your test. If the patient has contrast allergy: Patient will need a prescription for Prednisone and very clear instructions (as follows): Prednisone 50 mg - take 13 hours prior to test Take another Prednisone 50 mg 7 hours prior to test Take another Prednisone 50 mg 1 hour prior to test Take Benadryl 50 mg 1 hour prior to  test Patient must complete all four doses of above prophylactic medications. Patient will need a ride after test due to Benadryl.  On the Day of the Test: Drink plenty of water until 1 hour prior to the test. Do not eat any food 1 hour prior to test. You may take your regular medications prior to the test.  Take metoprolol (Lopressor) two hours prior to test. HOLD Paxil day of the test.  If you take Furosemide/Hydrochlorothiazide/Spironolactone, please HOLD on the morning of the test. FEMALES- please wear underwire-free bra if available, avoid dresses & tight clothing       After the Test: Drink plenty of water. After receiving IV contrast, you may experience a mild flushed feeling. This is normal. On occasion, you may experience a mild rash up to 24 hours after the test. This is not dangerous. If this occurs, you can take Benadryl 25 mg and increase your fluid intake. If you experience trouble breathing, this can be serious. If it is severe call 911 IMMEDIATELY. If it is mild, please call our office. If you take any of these medications: Glipizide/Metformin, Avandament, Glucavance, please do not take 48 hours after completing test unless otherwise instructed.  We will call to schedule your test 2-4 weeks out understanding that some insurance companies will need an  authorization prior to the service being performed.   For more information and frequently asked questions, please visit our website : [email or web address]  For non-scheduling related questions, please contact the cardiac imaging nurse navigator should you have any questions/concerns: Rockwell Alexandria, Cardiac Imaging Nurse Navigator Larey Brick, Cardiac Imaging Nurse Navigator [facility] Direct Office Dial: [phone]   For scheduling needs, including cancellations and rescheduling, please call [place], [phone].    Follow-Up: At [facility], you and your health  needs are our priority.  As part of our continuing mission to provide you with exceptional heart care, we have created designated Provider Care Teams.  These Care Teams include your primary Cardiologist (physician) and Advanced Practice Providers (APPs -  Physician Assistants and Nurse Practitioners) who all work together to provide you with the care you need, when you need it.    Your next appointment:   16 week(s)  Provider:   Thomasene Ripple, DO     Adopting a Healthy Lifestyle.  Know what a healthy weight is for you (roughly BMI <25) and aim to maintain this   Aim for 7+ servings of fruits and vegetables daily   65-80+ fluid ounces of water or unsweet tea for healthy kidneys   Limit to max 1 drink of alcohol per day; avoid smoking/tobacco   Limit animal fats in diet for cholesterol and heart health - choose grass fed whenever available   Avoid highly processed foods, and foods high in saturated/trans fats   Aim for low stress - take time to unwind and care for your mental health   Aim for 150 min of moderate intensity exercise weekly for heart health, and weights twice weekly for bone health   Aim for 7-9 hours of sleep daily   When it comes to diets, agreement about the perfect plan isnt easy to find, even among the experts. Experts at the [facility] [employer] developed an idea known as the Healthy Eating Plate. Just imagine a plate divided into logical, healthy portions.   The emphasis is on diet quality:   Load up on vegetables and fruits - one-half of your plate: Aim for color and variety, and remember that potatoes dont count.   Go for whole grains - one-quarter of your plate: Whole wheat, barley, wheat berries, quinoa, oats, brown rice, and foods made with them. If you want pasta, go with whole wheat pasta.   Protein power - one-quarter of your plate: Fish, chicken, beans, and nuts are all healthy, versatile protein sources. Limit red meat.   The diet,  however, does go beyond the plate, offering a few other suggestions.   Use healthy plant oils, such as olive, canola, soy, corn, sunflower and peanut. Check the labels, and avoid partially hydrogenated oil, which have unhealthy trans fats.   If youre thirsty, drink water. Coffee and tea are good in moderation, but skip sugary drinks and limit milk and dairy products to one or two daily servings.   The type of carbohydrate in the diet is more important than the amount. Some sources of carbohydrates, such as vegetables, fruits, whole grains, and beans-are healthier than others.   Finally, stay active  Signed, Thomasene Ripple, DO  03/30/2023 11:22 AM    [facility]

## 2023-03-30 NOTE — Telephone Encounter (Signed)
[facility] called, reports patient had K 5.9 on CMP. Lab reviewed. Not on medication would cause significant hyperkalemia. Historically K WNL. Called patient. . Will need PO Lokelma 10g x1 and repeat BMP tomorrow at [facility] office. Order sent to [facility]. Confirmed have medication in stock. Patient agreeable with plan.

## 2023-03-30 NOTE — Progress Notes (Unsigned)
Enrolled for [employer] to mail a ZIO XT long term holter monitor to the patients address on file.  

## 2023-03-30 NOTE — Patient Instructions (Addendum)
Medication Instructions:  Your physician recommends that you continue on your current medications as directed. Please refer to the Current Medication list given to you today.  *If you need a refill on your cardiac medications before your next appointment, please call your pharmacy*   Lab Work: Your physician recommends that you have labs drawn today: CMET, Mag, Lipids,TSH, HgbA1c If you have labs (blood work) drawn today and your tests are completely normal, you will receive your results only by: MyChart Message (if you have MyChart) OR A paper copy in the mail If you have any lab test that is abnormal or we need to change your treatment, we will call you to review the results.   Testing/Procedures: Christena Deem- Long Term Monitor Instructions  Your physician has requested you wear a ZIO patch monitor for 14 days.  This is a single patch monitor. [employer] supplies one patch monitor per enrollment. Additional stickers are not available. Please do not apply patch if you will be having a Nuclear Stress Test,  Echocardiogram, Cardiac CT, MRI, or Chest Xray during the period you would be wearing the  monitor. The patch cannot be worn during these tests. You cannot remove and re-apply the  ZIO XT patch monitor.  Your ZIO patch monitor will be mailed 3 day USPS to your address on file. It may take 3-5 days  to receive your monitor after you have been enrolled.  Once you have received your monitor, please review the enclosed instructions. Your monitor  has already been registered assigning a specific monitor serial # to you.  Billing and Patient Assistance Program Information  We have supplied [employer] with any of your insurance information on file for billing purposes. [employer] offers a sliding scale Patient Assistance Program for patients that do not have  insurance, or whose insurance does not completely cover the cost of the ZIO monitor.  You must apply for the Patient Assistance Program to  qualify for this discounted rate.  To apply, please call [employer] at [phone], select option 4, select option 2, ask to apply for  Patient Assistance Program. Meredeth Ide will ask your household income, and how many people  are in your household. They will quote your out-of-pocket cost based on that information.  [employer] will also be able to set up a 84-month, interest-free payment plan if needed.  Applying the monitor   Shave hair from upper left chest.  Hold abrader disc by orange tab. Rub abrader in 40 strokes over the upper left chest as  indicated in your monitor instructions.  Clean area with 4 enclosed alcohol pads. Let dry.  Apply patch as indicated in monitor instructions. Patch will be placed under collarbone on left  side of chest with arrow pointing upward.  Rub patch adhesive wings for 2 minutes. Remove white label marked "1". Remove the white  label marked "2". Rub patch adhesive wings for 2 additional minutes.  While looking in a mirror, press and release button in center of patch. A small green light will  flash 3-4 times. This will be your only indicator that the monitor has been turned on.  Do not shower for the first 24 hours. You may shower after the first 24 hours.  Press the button if you feel a symptom. You will hear a small click. Record Date, Time and  Symptom in the Patient Logbook.  When you are ready to remove the patch, follow instructions on the last 2 pages of Patient  Logbook. Stick patch monitor  onto the last page of Patient Logbook.  Place Patient Logbook in the blue and white box. Use locking tab on box and tape box closed  securely. The blue and white box has prepaid postage on it. Please place it in the mailbox as  soon as possible. Your physician should have your test results approximately 7 days after the  monitor has been mailed back to [facility].  Call [facility] Customer Care at [phone] if you have questions regarding  your ZIO XT  patch monitor. Call them immediately if you see an orange light blinking on your  monitor.  If your monitor falls off in less than 4 days, contact our Monitor department at [phone].  If your monitor becomes loose or falls off after 4 days call [employer] at [phone] for  suggestions on securing your monitor    Your cardiac CT will be scheduled at one of the below locations:   [facility] [address] [place], [place] [address] [phone]   If scheduled at [facility], please arrive at the [facility] and [facility] Entrance (Entrance C2) of [facility] 30 minutes prior to test start time. You can use the FREE valet parking offered at entrance C (encouraged to control the heart rate for the test)  Proceed to the [facility] Radiology Department (first floor) to check-in and test prep.  All radiology patients and guests should use entrance C2 at [facility], accessed from [facility], even though the hospital's physical address listed is [address].    Please follow these instructions carefully (unless otherwise directed):   On the Night Before the Test: Be sure to Drink plenty of water. Do not consume any caffeinated/decaffeinated beverages or chocolate 12 hours prior to your test. Do not take any antihistamines 12 hours prior to your test. If the patient has contrast allergy: Patient will need a prescription for Prednisone and very clear instructions (as follows): Prednisone 50 mg - take 13 hours prior to test Take another Prednisone 50 mg 7 hours prior to test Take another Prednisone 50 mg 1 hour prior to test Take Benadryl 50 mg 1 hour prior to test Patient must complete all four doses of above prophylactic medications. Patient will need a ride after test due to Benadryl.  On the Day of the Test: Drink plenty of water until 1 hour prior to the test. Do not eat any food 1 hour prior to test. You may take  your regular medications prior to the test.  Take metoprolol (Lopressor) two hours prior to test. HOLD Paxil day of the test.  If you take Furosemide/Hydrochlorothiazide/Spironolactone, please HOLD on the morning of the test. FEMALES- please wear underwire-free bra if available, avoid dresses & tight clothing       After the Test: Drink plenty of water. After receiving IV contrast, you may experience a mild flushed feeling. This is normal. On occasion, you may experience a mild rash up to 24 hours after the test. This is not dangerous. If this occurs, you can take Benadryl 25 mg and increase your fluid intake. If you experience trouble breathing, this can be serious. If it is severe call 911 IMMEDIATELY. If it is mild, please call our office. If you take any of these medications: Glipizide/Metformin, Avandament, Glucavance, please do not take 48 hours after completing test unless otherwise instructed.  We will call to schedule your test 2-4 weeks out understanding that some insurance companies will need an  authorization prior to the service being performed.   For more information and frequently asked questions, please visit our website : [email or web address]  For non-scheduling related questions, please contact the cardiac imaging nurse navigator should you have any questions/concerns: Rockwell Alexandria, Cardiac Imaging Nurse Navigator Larey Brick, Cardiac Imaging Nurse Navigator [facility] Direct Office Dial: [phone]   For scheduling needs, including cancellations and rescheduling, please call [place], [phone].    Follow-Up: At [facility], you and your health needs are our priority.  As part of our continuing mission to provide you with exceptional heart care, we have created designated Provider Care Teams.  These Care Teams include your primary Cardiologist (physician) and Advanced Practice Providers (APPs -  Physician  Assistants and Nurse Practitioners) who all work together to provide you with the care you need, when you need it.    Your next appointment:   16 week(s)  Provider:   Thomasene Ripple, DO

## 2023-03-31 ENCOUNTER — Other Ambulatory Visit: Payer: Self-pay

## 2023-03-31 DIAGNOSIS — E875 Hyperkalemia: Secondary | ICD-10-CM

## 2023-04-01 ENCOUNTER — Telehealth (HOSPITAL_COMMUNITY): Payer: Self-pay | Admitting: *Deleted

## 2023-04-01 LAB — BASIC METABOLIC PANEL
BUN/Creatinine Ratio: 17 (ref 12–28)
BUN: 14 mg/dL (ref 8–27)
CO2: 24 mmol/L (ref 20–29)
Calcium: 9.7 mg/dL (ref 8.7–10.3)
Chloride: 105 mmol/L (ref 96–106)
Creatinine, Ser: 0.83 mg/dL (ref 0.57–1.00)
Glucose: 93 mg/dL (ref 70–99)
Potassium: 5.2 mmol/L (ref 3.5–5.2)
Sodium: 143 mmol/L (ref 134–144)
eGFR: 75 mL/min/{1.73_m2} (ref >59)

## 2023-04-01 NOTE — Telephone Encounter (Signed)
Calling patient to schedule her cardiac CT scan.  Appointment made for Friday, July 12 at 12 pm. Pt denies an allergy to CT contrast stating she has an allergy to shrimp.  Patient verbalized possession of CCTA instructions. She was made aware to arrive at 11:30 am and to take her 100mg  metoprolol tartrate at 10 am. She will call back if she has any questions.  Larey Brick RN Navigator Cardiac Imaging [facility] [phone] office [phone] cell

## 2023-04-03 ENCOUNTER — Ambulatory Visit (HOSPITAL_COMMUNITY)
Admission: RE | Admit: 2023-04-03 | Discharge: 2023-04-03 | Disposition: A | Discharge status: Home / Self Care | Payer: Medicare Other | Source: Ambulatory Visit | Attending: Cardiology | Admitting: Cardiology

## 2023-04-03 DIAGNOSIS — R0609 Other forms of dyspnea: Secondary | ICD-10-CM | POA: Diagnosis not present

## 2023-04-03 DIAGNOSIS — R072 Precordial pain: Secondary | ICD-10-CM

## 2023-04-03 DIAGNOSIS — I493 Ventricular premature depolarization: Secondary | ICD-10-CM

## 2023-04-03 MED ORDER — IOHEXOL 350 MG/ML SOLN
100.0000 mL | Freq: Once | INTRAVENOUS | Status: AC | PRN
  Administered 2023-04-03: 100 mL via INTRAVENOUS

## 2023-04-03 MED ORDER — NITROGLYCERIN 0.4 MG SL SUBL
SUBLINGUAL_TABLET | SUBLINGUAL | Status: AC
  Filled 2023-04-03: qty 2

## 2023-04-03 MED ORDER — NITROGLYCERIN 0.4 MG SL SUBL
0.8000 mg | SUBLINGUAL_TABLET | Freq: Once | SUBLINGUAL | Status: AC
  Administered 2023-04-03: 0.8 mg via SUBLINGUAL

## 2023-04-03 NOTE — Progress Notes (Signed)
Patient tolerated CT well.Vital signs stable encourage to drink water throughout day.Reasons explained and verbalized understanding. Ambulated steady gait.   

## 2023-04-14 ENCOUNTER — Telehealth: Payer: Self-pay | Admitting: Cardiology

## 2023-04-14 ENCOUNTER — Other Ambulatory Visit (HOSPITAL_BASED_OUTPATIENT_CLINIC_OR_DEPARTMENT_OTHER): Payer: Self-pay

## 2023-04-14 MED ORDER — HYDROCODONE-ACETAMINOPHEN 10-325 MG PO TABS
1.0000 | ORAL_TABLET | Freq: Four times a day (QID) | ORAL | 0 refills | Status: DC | PRN
  Filled 2023-04-14: qty 180, 45d supply, fill #0

## 2023-04-14 NOTE — Telephone Encounter (Signed)
Patient called to get a referral from Dr. Servando Salina to an Endocrinologist.

## 2023-04-14 NOTE — Telephone Encounter (Signed)
Patient states another doctor recommended her see endocrinology. Advised to call that physician to have them send referral.  She will contact their office

## 2023-04-15 ENCOUNTER — Other Ambulatory Visit (HOSPITAL_BASED_OUTPATIENT_CLINIC_OR_DEPARTMENT_OTHER): Payer: Self-pay

## 2023-04-15 MED ORDER — ALPRAZOLAM 0.5 MG PO TABS
0.5000 mg | ORAL_TABLET | Freq: Three times a day (TID) | ORAL | 0 refills | Status: DC | PRN
  Filled 2023-04-15 – 2023-04-22 (×2): qty 90, 30d supply, fill #0

## 2023-04-15 MED ORDER — PAROXETINE HCL 20 MG PO TABS
20.0000 mg | ORAL_TABLET | Freq: Every day | ORAL | 1 refills | Status: DC
  Filled 2023-04-15 – 2023-04-22 (×2): qty 30, 30d supply, fill #0

## 2023-04-22 ENCOUNTER — Other Ambulatory Visit: Payer: Self-pay

## 2023-04-22 ENCOUNTER — Other Ambulatory Visit (HOSPITAL_BASED_OUTPATIENT_CLINIC_OR_DEPARTMENT_OTHER): Payer: Self-pay

## 2023-04-22 MED ORDER — ROSUVASTATIN CALCIUM 10 MG PO TABS: 10.0000 mg | ORAL_TABLET | Freq: Every evening | ORAL | 3 refills | Status: DC

## 2023-04-22 MED ORDER — ASPIRIN 81 MG PO TBEC: 81.0000 mg | DELAYED_RELEASE_TABLET | Freq: Every day | ORAL | Status: AC

## 2023-04-22 NOTE — Progress Notes (Signed)
Prescription sent to pharmacy.

## 2023-04-23 ENCOUNTER — Other Ambulatory Visit: Payer: Self-pay

## 2023-05-12 ENCOUNTER — Other Ambulatory Visit (HOSPITAL_BASED_OUTPATIENT_CLINIC_OR_DEPARTMENT_OTHER): Payer: Self-pay

## 2023-05-12 MED ORDER — HYDROCODONE-ACETAMINOPHEN 10-325 MG PO TABS
1.0000 | ORAL_TABLET | ORAL | 0 refills | Status: DC | PRN
  Filled 2023-05-13: qty 180, 30d supply, fill #0

## 2023-05-13 ENCOUNTER — Other Ambulatory Visit (HOSPITAL_BASED_OUTPATIENT_CLINIC_OR_DEPARTMENT_OTHER): Payer: Self-pay

## 2023-05-13 ENCOUNTER — Other Ambulatory Visit (HOSPITAL_COMMUNITY): Payer: Self-pay

## 2023-05-13 ENCOUNTER — Other Ambulatory Visit: Payer: Self-pay

## 2023-06-03 ENCOUNTER — Encounter: Payer: Self-pay | Admitting: Nurse Practitioner

## 2023-06-03 ENCOUNTER — Ambulatory Visit (INDEPENDENT_AMBULATORY_CARE_PROVIDER_SITE_OTHER): Payer: Medicare Other | Admitting: Nurse Practitioner

## 2023-06-03 VITALS — BP 142/84 | HR 96 | Temp 98.0°F | Resp 14 | Ht 63.0 in | Wt 135.0 lb

## 2023-06-03 DIAGNOSIS — R7303 Prediabetes: Secondary | ICD-10-CM

## 2023-06-03 DIAGNOSIS — I10 Essential (primary) hypertension: Secondary | ICD-10-CM

## 2023-06-03 DIAGNOSIS — E538 Deficiency of other specified B group vitamins: Secondary | ICD-10-CM

## 2023-06-03 DIAGNOSIS — E559 Vitamin D deficiency, unspecified: Secondary | ICD-10-CM | POA: Diagnosis not present

## 2023-06-03 DIAGNOSIS — Z13220 Encounter for screening for lipoid disorders: Secondary | ICD-10-CM | POA: Diagnosis not present

## 2023-06-03 MED ORDER — FEXOFENADINE HCL 180 MG PO TABS: 180.0000 mg | ORAL_TABLET | Freq: Every day | ORAL | 0 refills | Status: DC

## 2023-06-03 NOTE — Assessment & Plan Note (Signed)
-   Lipid Panel   2. Primary hypertension  - CBC - Comprehensive metabolic panel   3. Vitamin D deficiency  - Vitamin D, 25-hydroxy   4. Vitamin B12 deficiency  - Vitamin B12   Follow up:  Follow up in 3 months

## 2023-06-03 NOTE — Patient Instructions (Addendum)
1. Lipid screening  - Lipid Panel   2. Primary hypertension  - CBC - Comprehensive metabolic panel   3. Vitamin D deficiency  - Vitamin D, 25-hydroxy   4. Vitamin B12 deficiency  - Vitamin B12   Follow up:  Follow up in 3 months   Hyperkalemia Hyperkalemia is when you have too much of a mineral called potassium in your blood. If there is too much potassium in your blood, it can affect how your heart works. Potassium is normally removed from your body by your kidneys. What are the causes? Taking in too much potassium. This can happen if: You use salt substitutes. You take potassium supplements. You eat too many foods that are high in potassium if you have kidney disease. Your body not being able to get rid of potassium. This can happen if: Your kidneys are not working properly. You are taking certain medicines. You have a condition called Addison's disease. You have kidney stones. You are on treatment to clean your blood (dialysis) and you skip a treatment. Your cells releasing a high amount of potassium into the blood. This can happen if: You have a muscle injury. You have very bad burns or infections. You have problems with your blood plasma. This can be caused by diabetes that is not well controlled. What increases the risk? Drinking too much alcohol. Using drugs a lot. What are the signs or symptoms? In many cases, there are no symptoms. But, when your potassium level becomes high enough, you may have symptoms such as: An irregular heartbeat. A very slow heartbeat. Feeling like you may vomit (nauseous). Tiredness. Confusion. Tingling of your skin. Numbness of your hands or feet. Muscle cramps. Muscle weakness. Not being able to move (paralysis). How is this treated? Treatment depends on how bad your condition is. Treatment may need to be done in the hospital. It may include: Receiving a fluid with sugar (glucose) in it through an IV tube, along with  insulin. Taking medicines. Having treatment to clean your blood. Taking calcium. Follow these instructions at home:  Take over-the-counter and prescription medicines only as told by your doctor. Do not take any of the following unless your doctor says it is okay: Supplements. Natural food products. Herbs. Vitamins. If you drink alcohol, limit how much you have as told by your doctor. Do not use illegal drugs. If you need help quitting, ask your doctor. If you have kidney disease, you may need to follow a low-potassium diet. A food expert (dietitian) can help you. Keep all follow-up visits. Contact a doctor if: Your heartbeat is not regular or is very slow. You feel dizzy. You feel weak. You feel like you may vomit. You have tingling in your hands or feet. You lose feeling in your hands or feet. Get help right away if: You are short of breath. You have chest pain. You faint. You cannot move your muscles. These symptoms may be an emergency. Get help right away. Call 911. Do not wait to see if the symptoms will go away. Do not drive yourself to the hospital. Summary Hyperkalemia is when you have too much potassium in your blood. Take over-the-counter and prescription medicines only as told by your doctor. Limit how much alcohol you have as told by your doctor. Contact a doctor if your heartbeat is not regular. This information is not intended to replace advice given to you by your health care provider. Make sure you discuss any questions you have with your health care  provider. Document Revised: 05/23/2021 Document Reviewed: 05/23/2021 Elsevier Patient Education  2024 [employer].

## 2023-06-03 NOTE — Progress Notes (Signed)
@Patient  ID: Michele [name], female    DOB: [birth year], 72 y.o.   MRN: [id number]  Chief Complaint  Patient presents with   Establish Care    Referring provider: No ref. provider found   HPI  Patient presents today for a visit to establish care.  She has recently seen cardiology and did have blood work there.  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  She states that her grandson does have mental issues and she does not feel safe at home with him.  We will get her set up with [facility] for counseling and hopefully community resources as well.  Patient has been diagnosed with prediabetes.  She is trying to follow a diabetic diet but has been having issues with appetite lately.  We will check labs today. Denies f/c/s, n/v/d, hemoptysis, PND, leg swelling Denies chest pain or edema     Allergies  Allergen Reactions   Iodine Rash   Latex Rash   Shellfish Allergy Other (See Comments)    Blood pressure drops    Immunization History  Administered Date(s) Administered   Fluad Quad(high Dose 65+) 07/06/2019   Influenza, High Dose Seasonal PF 07/08/2017, 07/02/2018   Influenza, Quadrivalent, Recombinant, Inj, Pf 07/03/2021   Influenza-Unspecified 06/18/2009, 06/10/2012, 06/27/2013   PFIZER Comirnaty(Gray Top)Covid-19 Tri-Sucrose Vaccine 07/03/2021   PFIZER(Purple Top)SARS-COV-2 Vaccination 11/13/2019, 12/07/2019, 06/26/2020, 12/27/2020   Pneumococcal Conjugate-13 06/09/2016   Pneumococcal Polysaccharide-23 06/10/2012, 08/02/2018   Tdap 11/22/2014   Unspecified SARS-COV-2 Vaccination 07/02/2022    Past Medical History:  Diagnosis Date   Anxiety    Depression    Dysrhythmia    tachy episodes   Endometriosis    Herniated disc    L5-S1   History of kidney stones    History of orthostatic hypotension 08-31-13   History of phlebitis    from IV   Interstitial cystitis    Lumbar  spondylosis    Multiple thyroid nodules    Peripheral vascular disease (HCC) 78   phlebitis lft arm- iv site   Phlebitis    Pneumonia    hx   PONV (postoperative nausea and vomiting)    last surgery okay   Shortness of breath    exersion   Spinal stenosis    Stroke (HCC) 5   tia   Tuberculosis    no tb but test pos   Unspecified disorder of adrenal glands    questionable problem to be investigated    Tobacco History: Social History   Tobacco Use  Smoking Status Former   Current packs/day: 0.00   Average packs/day: 1 pack/day for 25.0 years (25.0 ttl pk-yrs)   Types: Cigarettes   Start date: 04/14/1987   Quit date: 04/13/2012   Years since quitting: 11.1  Smokeless Tobacco Never  Tobacco Comments   quit july 2013   Counseling given: Not Answered Tobacco comments: quit july 2013   Outpatient Encounter Medications as of 06/03/2023  Medication Sig   ALPRAZolam (XANAX) 0.5 MG tablet Take 1 tablet (0.5 mg total) by mouth 3 (three) times daily as needed.   aspirin EC 81 MG tablet Take 1 tablet (81 mg total) by mouth daily. Swallow whole.   fexofenadine (ALLEGRA ALLERGY) 180 MG tablet Take 1 tablet (180 mg total) by mouth daily.   HYDROcodone-acetaminophen (NORCO) 10-325 MG per tablet Take 1 tablet by mouth every 6 (six) hours as  needed for moderate pain.    HYDROcodone-acetaminophen (NORCO) 10-325 MG tablet Take 1 tablet by mouth 4 (four) times daily as needed for pain   HYDROcodone-acetaminophen (NORCO) 10-325 MG tablet Take 1 tablet by mouth every 4 (four) hours as needed for pain.   PARoxetine (PAXIL) 20 MG tablet Take 1 tablet (20 mg total) by mouth daily.   rosuvastatin (CRESTOR) 10 MG tablet Take 1 tablet (10 mg total) by mouth at bedtime.   ALPRAZolam (XANAX) 0.5 MG tablet Take 1 tablet (0.5 mg total) by mouth 3 (three) times daily as needed. (Patient not taking: Reported on 06/03/2023)   PARoxetine (PAXIL) 20 MG tablet Take 1 tablet (20 mg total) by mouth daily.  (Patient not taking: Reported on 06/03/2023)   tiZANidine (ZANAFLEX) 2 MG tablet Take 1 tablet (2 mg total) by mouth daily as needed. (Patient not taking: Reported on 06/03/2023)   [DISCONTINUED] fludrocortisone (FLORINEF) 0.1 MG tablet Take 0.1 mg by mouth daily.   [DISCONTINUED] metoprolol tartrate (LOPRESSOR) 100 MG tablet Take 2 hours prior to CT   [DISCONTINUED] topiramate (TOPAMAX) 50 MG tablet Take 1 tablet (50 mg total) by mouth 2 (two) times daily.   No facility-administered encounter medications on file as of 06/03/2023.     Review of Systems  Review of Systems  Constitutional: Negative.   HENT: Negative.    Cardiovascular: Negative.   Gastrointestinal: Negative.   Allergic/Immunologic: Negative.   Neurological: Negative.   Psychiatric/Behavioral: Negative.         Physical Exam  BP (!) 142/84 (BP Location: Left Arm, Patient Position: Sitting, Cuff Size: Normal)   Pulse 96   Temp 98 F (36.7 C)   Resp 14   Ht 5\' 3"  (1.6 m)   Wt 135 lb (61.2 kg)   SpO2 98%   BMI 23.91 kg/m   Wt Readings from Last 5 Encounters:  06/03/23 135 lb (61.2 kg)  03/30/23 138 lb 9.6 oz (62.9 kg)  04/12/21 140 lb (63.5 kg)  01/12/14 126 lb (57.2 kg)  12/16/13 133 lb 13.1 oz (60.7 kg)     Physical Exam Vitals and nursing note reviewed.  Constitutional:      General: She is not in acute distress.    Appearance: She is well-developed.  Cardiovascular:     Rate and Rhythm: Normal rate and regular rhythm.  Pulmonary:     Effort: Pulmonary effort is normal.     Breath sounds: Normal breath sounds.  Neurological:     Mental Status: She is alert and oriented to person, place, and time.      Lab Results:  CBC    Component Value Date/Time   WBC 7.5 12/12/2013 1344   RBC 4.14 12/12/2013 1344   HGB 13.5 12/12/2013 1344   HCT 39.7 12/12/2013 1344   PLT 269 12/12/2013 1344   MCV 95.9 12/12/2013 1344   MCH 32.6 12/12/2013 1344   MCHC 34.0 12/12/2013 1344   RDW 12.8 12/12/2013  1344    BMET    Component Value Date/Time   NA 143 03/31/2023 0919   K 5.2 03/31/2023 0919   CL 105 03/31/2023 0919   CO2 24 03/31/2023 0919   GLUCOSE 93 03/31/2023 0919   GLUCOSE 113 (H) 12/12/2013 1344   BUN 14 03/31/2023 0919   CREATININE 0.83 03/31/2023 0919   CALCIUM 9.7 03/31/2023 0919   GFRNONAA >90 12/12/2013 1344   GFRAA >90 12/12/2013 1344      Assessment & Plan:   Lipid screening - Lipid  Panel   2. Primary hypertension  - CBC - Comprehensive metabolic panel   3. Vitamin D deficiency  - Vitamin D, 25-hydroxy   4. Vitamin B12 deficiency  - Vitamin B12   Follow up:  Follow up in 3 months     Ivonne Andrew, NP 06/03/2023

## 2023-06-04 ENCOUNTER — Other Ambulatory Visit: Payer: Self-pay | Admitting: Nurse Practitioner

## 2023-06-04 ENCOUNTER — Other Ambulatory Visit (HOSPITAL_BASED_OUTPATIENT_CLINIC_OR_DEPARTMENT_OTHER): Payer: Self-pay

## 2023-06-04 ENCOUNTER — Telehealth: Payer: Self-pay | Admitting: General Practice

## 2023-06-04 ENCOUNTER — Other Ambulatory Visit (INDEPENDENT_AMBULATORY_CARE_PROVIDER_SITE_OTHER): Payer: Medicare Other

## 2023-06-04 DIAGNOSIS — E538 Deficiency of other specified B group vitamins: Secondary | ICD-10-CM

## 2023-06-04 DIAGNOSIS — Z13220 Encounter for screening for lipoid disorders: Secondary | ICD-10-CM

## 2023-06-04 DIAGNOSIS — R399 Unspecified symptoms and signs involving the genitourinary system: Secondary | ICD-10-CM | POA: Diagnosis not present

## 2023-06-04 DIAGNOSIS — E559 Vitamin D deficiency, unspecified: Secondary | ICD-10-CM

## 2023-06-04 DIAGNOSIS — R350 Frequency of micturition: Secondary | ICD-10-CM

## 2023-06-04 DIAGNOSIS — I10 Essential (primary) hypertension: Secondary | ICD-10-CM

## 2023-06-04 LAB — POCT URINALYSIS DIP (CLINITEK)
Bilirubin, UA: NEGATIVE
Glucose, UA: NEGATIVE mg/dL
Ketones, POC UA: NEGATIVE mg/dL
Nitrite, UA: NEGATIVE
POC PROTEIN,UA: NEGATIVE
Spec Grav, UA: 1.025 (ref 1.010–1.025)
Urobilinogen, UA: 0.2 U/dL
pH, UA: 5.5 (ref 5.0–8.0)

## 2023-06-04 MED ORDER — SULFAMETHOXAZOLE-TRIMETHOPRIM 800-160 MG PO TABS: 1.0000 | ORAL_TABLET | Freq: Two times a day (BID) | ORAL | 0 refills | Status: AC

## 2023-06-04 MED ORDER — PAROXETINE HCL 20 MG PO TABS
20.0000 mg | ORAL_TABLET | Freq: Every day | ORAL | 1 refills | Status: DC
  Filled 2023-06-04: qty 30, 30d supply, fill #0
  Filled 2023-06-10 – 2023-09-28 (×3): qty 30, 30d supply, fill #1

## 2023-06-04 MED ORDER — HYDROCODONE-ACETAMINOPHEN 10-325 MG PO TABS
1.0000 | ORAL_TABLET | ORAL | 0 refills | Status: AC | PRN
  Filled 2023-06-10: qty 180, 30d supply, fill #0

## 2023-06-04 NOTE — Telephone Encounter (Signed)
error 

## 2023-06-05 ENCOUNTER — Encounter: Payer: Self-pay | Admitting: Cardiology

## 2023-06-05 ENCOUNTER — Other Ambulatory Visit (HOSPITAL_BASED_OUTPATIENT_CLINIC_OR_DEPARTMENT_OTHER): Payer: Self-pay

## 2023-06-05 ENCOUNTER — Ambulatory Visit: Payer: Medicare Other | Attending: Cardiology | Admitting: Cardiology

## 2023-06-05 VITALS — BP 146/92 | HR 90 | Ht 62.0 in | Wt 136.2 lb

## 2023-06-05 DIAGNOSIS — I493 Ventricular premature depolarization: Secondary | ICD-10-CM | POA: Diagnosis present

## 2023-06-05 DIAGNOSIS — G90A Postural orthostatic tachycardia syndrome (POTS): Secondary | ICD-10-CM | POA: Diagnosis not present

## 2023-06-05 DIAGNOSIS — G459 Transient cerebral ischemic attack, unspecified: Secondary | ICD-10-CM | POA: Insufficient documentation

## 2023-06-05 DIAGNOSIS — I471 Supraventricular tachycardia, unspecified: Secondary | ICD-10-CM | POA: Diagnosis present

## 2023-06-05 DIAGNOSIS — G4733 Obstructive sleep apnea (adult) (pediatric): Secondary | ICD-10-CM | POA: Diagnosis present

## 2023-06-05 DIAGNOSIS — I10 Essential (primary) hypertension: Secondary | ICD-10-CM | POA: Diagnosis present

## 2023-06-05 DIAGNOSIS — I2510 Atherosclerotic heart disease of native coronary artery without angina pectoris: Secondary | ICD-10-CM | POA: Insufficient documentation

## 2023-06-05 LAB — COMPREHENSIVE METABOLIC PANEL
ALT: 5 IU/L (ref 0–32)
AST: 16 IU/L (ref 0–40)
Albumin: 4.7 g/dL (ref 3.8–4.8)
Alkaline Phosphatase: 68 IU/L (ref 44–121)
BUN/Creatinine Ratio: 15 (ref 12–28)
BUN: 12 mg/dL (ref 8–27)
Bilirubin Total: 0.3 mg/dL (ref 0.0–1.2)
CO2: 23 mmol/L (ref 20–29)
Calcium: 9.5 mg/dL (ref 8.7–10.3)
Chloride: 102 mmol/L (ref 96–106)
Creatinine, Ser: 0.82 mg/dL (ref 0.57–1.00)
Globulin, Total: 2.4 g/dL (ref 1.5–4.5)
Glucose: 105 mg/dL — ABNORMAL HIGH (ref 70–99)
Potassium: 4.3 mmol/L (ref 3.5–5.2)
Sodium: 141 mmol/L (ref 134–144)
Total Protein: 7.1 g/dL (ref 6.0–8.5)
eGFR: 76 mL/min/{1.73_m2} (ref >59)

## 2023-06-05 LAB — LIPID PANEL
Chol/HDL Ratio: 1.9 ratio (ref 0.0–4.4)
Cholesterol, Total: 112 mg/dL (ref 100–199)
HDL: 59 mg/dL (ref >39)
LDL Chol Calc (NIH): 40 mg/dL (ref 0–99)
Triglycerides: 61 mg/dL (ref 0–149)
VLDL Cholesterol Cal: 13 mg/dL (ref 5–40)

## 2023-06-05 LAB — CBC
Hematocrit: 43.2 % (ref 34.0–46.6)
Hemoglobin: 14.3 g/dL (ref 11.1–15.9)
MCH: 31.4 pg (ref 26.6–33.0)
MCHC: 33.1 g/dL (ref 31.5–35.7)
MCV: 95 fL (ref 79–97)
Platelets: 286 10*3/uL (ref 150–450)
RBC: 4.56 x10E6/uL (ref 3.77–5.28)
RDW: 13.4 % (ref 11.7–15.4)
WBC: 6.7 10*3/uL (ref 3.4–10.8)

## 2023-06-05 LAB — VITAMIN D 25 HYDROXY (VIT D DEFICIENCY, FRACTURES): Vit D, 25-Hydroxy: 26 ng/mL — ABNORMAL LOW (ref 30.0–100.0)

## 2023-06-05 LAB — VITAMIN B12: Vitamin B-12: 530 pg/mL (ref 232–1245)

## 2023-06-05 MED ORDER — DILTIAZEM HCL 120 MG PO TABS
120.0000 mg | ORAL_TABLET | Freq: Four times a day (QID) | ORAL | 3 refills | Status: DC
  Filled 2023-06-05: qty 90, 23d supply, fill #0
  Filled 2023-06-29: qty 90, 23d supply, fill #1
  Filled 2023-08-24: qty 90, 23d supply, fill #2
  Filled 2023-11-25: qty 90, 23d supply, fill #3

## 2023-06-05 MED ORDER — ATORVASTATIN CALCIUM 10 MG PO TABS
10.0000 mg | ORAL_TABLET | Freq: Every day | ORAL | 3 refills | Status: DC
  Filled 2023-06-05: qty 90, 90d supply, fill #0
  Filled 2023-08-24 – 2023-08-31 (×3): qty 90, 90d supply, fill #1

## 2023-06-05 NOTE — Patient Instructions (Addendum)
Medication Instructions:  Your physician has recommended you make the following change in your medication:  STOP: Crestor  START: Atorvastatin (Lipitor) 10 mg once daily START: Diltiazem (Caredizem) 120 mg once daily *If you need a refill on your cardiac medications before your next appointment, please call your pharmacy*   Lab Work: None If you have labs (blood work) drawn today and your tests are completely normal, you will receive your results only by: MyChart Message (if you have MyChart) OR A paper copy in the mail If you have any lab test that is abnormal or we need to change your treatment, we will call you to review the results.   Testing/Procedures: None   Follow-Up: At [facility], you and your health needs are our priority.  As part of our continuing mission to provide you with exceptional heart care, we have created designated Provider Care Teams.  These Care Teams include your primary Cardiologist (physician) and Advanced Practice Providers (APPs -  Physician Assistants and Nurse Practitioners) who all work together to provide you with the care you need, when you need it.  Your next appointment:   6 month(s)  Provider:   Thomasene Ripple, DO

## 2023-06-05 NOTE — Progress Notes (Signed)
Cardiology Office Note:    Date:  06/05/2023   ID:  Michele [name], DOB 1951-05-27, MRN [id number]  PCP:  Patient, No Pcp Per  Cardiologist:  Thomasene Ripple, DO  Electrophysiologist:  None   Referring MD: No ref. provider found     History of Present Illness:    Michele [name] is a 72 y.o. female with a hx of mild coronary artery disease hypertension, hyperlipidemia, prediabetes, paroxysmal supraventricular tachycardia, history of partial orthostatic tachycardia syndrome, history of PVCs here today to establish cardiac care.  I saw for the first time in July 2024 at that time she was still experiencing palpitations.  Review her prior monitors which showed PVCs.  Since her visit with me she wore monitor and had a coronary CTA which showed mild coronary artery disease.  Other than being under a lot of stress due to taking care of her father she offers no other complaints at this time.   Past Medical History:  Diagnosis Date   Anxiety    Depression    Dysrhythmia    tachy episodes   Endometriosis    Herniated disc    L5-S1   History of kidney stones    History of orthostatic hypotension 08-31-13   History of phlebitis    from IV   Interstitial cystitis    Lumbar spondylosis    Multiple thyroid nodules    Peripheral vascular disease (HCC) 78   phlebitis lft arm- iv site   Phlebitis    Pneumonia    hx   PONV (postoperative nausea and vomiting)    last surgery okay   Shortness of breath    exersion   Spinal stenosis    Stroke (HCC) 5   tia   Tuberculosis    no tb but test pos   Unspecified disorder of adrenal glands    questionable problem to be investigated    Past Surgical History:  Procedure Laterality Date   CESAREAN SECTION  1985   LAMINOTOMY  2004   RIGHT INTERLAMINAR LAMINOTOMY, MICRODISKECTOMY, L5-S1    LITHOTRIPSY     NASAL SINUS SURGERY  1996   THYROIDECTOMY Right 12/16/2013   DR SHOEMAKER   THYROIDECTOMY Right 12/16/2013   Procedure: RIGHT  THYROIDECTOMY;  Surgeon: Osborn Coho, MD;  Location: [facility] OR;  Service: ENT;  Laterality: Right;   TONSILLECTOMY AND ADENOIDECTOMY  1958   TOTAL ABDOMINAL HYSTERECTOMY W/ BILATERAL SALPINGOOPHORECTOMY  2004   VAGINAL BIRTH AFTER CESAREAN SECTION  1986    Current Medications: Current Meds  Medication Sig   ALPRAZolam (XANAX) 0.5 MG tablet Take 1 tablet (0.5 mg total) by mouth 3 (three) times daily as needed.   aspirin EC 81 MG tablet Take 1 tablet (81 mg total) by mouth daily. Swallow whole.   atorvastatin (LIPITOR) 10 MG tablet Take 1 tablet (10 mg total) by mouth daily.   cetirizine (ZYRTEC) 10 MG tablet Take 10 mg by mouth as needed.   diclofenac Sodium (VOLTAREN) 1 % GEL Apply 2 g topically as needed.   diltiazem (CARDIZEM) 120 MG tablet Take 1 tablet (120 mg total) by mouth 4 (four) times daily.   fexofenadine (ALLEGRA ALLERGY) 180 MG tablet Take 1 tablet (180 mg total) by mouth daily.   fluticasone (FLONASE) 50 MCG/ACT nasal spray Place 1 spray into both nostrils as needed.   HYDROcodone-acetaminophen (NORCO) 10-325 MG tablet Take 1 tablet by mouth every 4 (four) hours as needed for pain.   PARoxetine (PAXIL) 20 MG tablet Take  1 tablet (20 mg total) by mouth daily.   sulfamethoxazole-trimethoprim (BACTRIM DS) 800-160 MG tablet Take 1 tablet by mouth 2 (two) times daily for 3 days.   [DISCONTINUED] rosuvastatin (CRESTOR) 10 MG tablet Take 1 tablet (10 mg total) by mouth at bedtime.     Allergies:   Iodine, Latex, and Shellfish allergy   Social History   Socioeconomic History   Marital status: Widowed    Spouse name: Not on file   Number of children: 2   Years of education: college2   Highest education level: Not on file  Occupational History   Occupation: retired  Tobacco Use   Smoking status: Former    Current packs/day: 0.00    Average packs/day: 1 pack/day for 25.0 years (25.0 ttl pk-yrs)    Types: Cigarettes    Start date: 04/14/1987    Quit date: 04/13/2012    Years  since quitting: 11.1   Smokeless tobacco: Never   Tobacco comments:    quit july 2013  Substance and Sexual Activity   Alcohol use: No   Drug use: No   Sexual activity: Not on file  Other Topics Concern   Not on file  Social History Narrative   Not on file   Social Determinants of Health   Financial Resource Strain: High Risk (08/21/2021)   Received from [facility]   Overall Financial Resource Strain (CARDIA)    Difficulty of Paying Living Expenses: Hard  Food Insecurity: No Food Insecurity (11/20/2021)   Received from [facility]   Hunger Vital Sign    Worried About Running Out of Food in the Last Year: Never true    Ran Out of Food in the Last Year: Never true  Transportation Needs: No Transportation Needs (08/21/2021)   Received from [employer], [facility]   PRAPARE - Transportation    Lack of Transportation (Medical): No    Lack of Transportation (Non-Medical): No  Physical Activity: Inactive (08/21/2021)   Received from [facility]   Exercise Vital Sign    Days of Exercise per Week: 0 days    Minutes of Exercise per Session: 0 min  Stress: Stress Concern Present (08/21/2021)   Received from [facility] - Occupational Stress Questionnaire    Feeling of Stress : Very much  Social Connections: Unknown (01/27/2022)   Received from [facility], [facility]   Social Network    Social Network: Not on file     Family History: The patient's family history includes Anxiety disorder in her mother; Arthritis in her mother; Breast cancer in her sister; CVA (age of onset: 61) in her paternal grandfather; CVA (age of onset: 51) in her paternal grandmother; Cervical cancer in her mother; Fibromyalgia in her sister; Goiter in her mother; Gout in her father; Heart attack in her maternal grandmother, paternal grandfather, and paternal grandmother; Heart attack (age of  onset: 90) in her sister; Heart attack (age of onset: 35) in her maternal grandfather; Heart disease in her father, mother, and sister; Hemochromatosis in her brother; Hepatitis C in her sister; Hyperlipidemia in her maternal grandfather, maternal grandmother, and paternal grandmother; Hypertension in her father, maternal grandfather, maternal grandmother, mother, paternal grandfather, paternal grandmother, and sister; Hypothyroidism in her father; Prostate cancer in her father.  ROS:   Review of Systems  Constitution: Negative for decreased appetite, fever and weight gain.  HENT: Negative for congestion, ear discharge, hoarse voice  and sore throat.   Eyes: Negative for discharge, redness, vision loss in right eye and visual halos.  Cardiovascular: Negative for chest pain, dyspnea on exertion, leg swelling, orthopnea and palpitations.  Respiratory: Negative for cough, hemoptysis, shortness of breath and snoring.   Endocrine: Negative for heat intolerance and polyphagia.  Hematologic/Lymphatic: Negative for bleeding problem. Does not bruise/bleed easily.  Skin: Negative for flushing, nail changes, rash and suspicious lesions.  Musculoskeletal: Negative for arthritis, joint pain, muscle cramps, myalgias, neck pain and stiffness.  Gastrointestinal: Negative for abdominal pain, bowel incontinence, diarrhea and excessive appetite.  Genitourinary: Negative for decreased libido, genital sores and incomplete emptying.  Neurological: Negative for brief paralysis, focal weakness, headaches and loss of balance.  Psychiatric/Behavioral: Negative for altered mental status, depression and suicidal ideas.  Allergic/Immunologic: Negative for HIV exposure and persistent infections.    EKGs/Labs/Other Studies Reviewed:    The following studies were reviewed today:   EKG:  The ekg ordered today demonstrates sinus rhythm with T wave abnormalities in the inferior and anterolateral leads.  Recent  Labs: 03/30/2023: Magnesium 2.1; TSH 2.850 06/04/2023: ALT 5; BUN 12; Creatinine, Ser 0.82; Hemoglobin 14.3; Platelets 286; Potassium 4.3; Sodium 141  Recent Lipid Panel    Component Value Date/Time   CHOL 112 06/04/2023 1052   TRIG 61 06/04/2023 1052   HDL 59 06/04/2023 1052   CHOLHDL 1.9 06/04/2023 1052   LDLCALC 40 06/04/2023 1052    Physical Exam:    VS:  BP (!) 146/92 (BP Location: Left Arm, Patient Position: Sitting, Cuff Size: Normal)   Pulse 90   Ht 5\' 2"  (1.575 m)   Wt 136 lb 3.2 oz (61.8 kg)   SpO2 96%   BMI 24.91 kg/m     Wt Readings from Last 3 Encounters:  06/05/23 136 lb 3.2 oz (61.8 kg)  06/03/23 135 lb (61.2 kg)  03/30/23 138 lb 9.6 oz (62.9 kg)     GEN: Well nourished, well developed in no acute distress HEENT: Normal NECK: No JVD; No carotid bruits LYMPHATICS: No lymphadenopathy CARDIAC: S1S2 noted,RRR, no murmurs, rubs, gallops RESPIRATORY:  Clear to auscultation without rales, wheezing or rhonchi  ABDOMEN: Soft, non-tender, non-distended, +bowel sounds, no guarding. EXTREMITIES: No edema, No cyanosis, no clubbing MUSCULOSKELETAL:  No deformity  SKIN: Warm and dry NEUROLOGIC:  Alert and oriented x 3, non-focal PSYCHIATRIC:  Normal affect, good insight  ASSESSMENT:    1. POTS (postural orthostatic tachycardia syndrome)   2. PVC's (premature ventricular contractions)   3. TIA (transient ischemic attack)   4. Coronary artery disease involving native coronary artery of native heart without angina pectoris   5. Primary hypertension   6. SVT (supraventricular tachycardia)   7. OSA (obstructive sleep apnea)     PLAN:    With her mild coronary artery disease-we will transition the patient off Crestor to Lipitor given her prediabetes.  Take Lipitor 10 mg daily with aspirin 81 mg daily.  We discussed her monitor result she is symptomatic with the PVCs and PACs and PSVT.  She is also with elevated blood pressure in the office.  She does not want to be on  a lot of medicines does not unreasonable.  She is agreeable for Cardizem 120 mg daily.  Hoping this will help her blood pressure and her symptoms with the PVCs.  Her potassium has improved.  The patient is in agreement with the above plan. The patient left the office in stable condition.  The patient will follow up in  Medication Adjustments/Labs and Tests Ordered: Current medicines are reviewed at length with the patient today.  Concerns regarding medicines are outlined above.  No orders of the defined types were placed in this encounter.  Meds ordered this encounter  Medications   diltiazem (CARDIZEM) 120 MG tablet    Sig: Take 1 tablet (120 mg total) by mouth 4 (four) times daily.    Dispense:  90 tablet    Refill:  3   atorvastatin (LIPITOR) 10 MG tablet    Sig: Take 1 tablet (10 mg total) by mouth daily.    Dispense:  90 tablet    Refill:  3    Patient Instructions  Medication Instructions:  Your physician has recommended you make the following change in your medication:  STOP: Crestor  START: Atorvastatin (Lipitor) 10 mg once daily START: Diltiazem (Caredizem) 120 mg once daily *If you need a refill on your cardiac medications before your next appointment, please call your pharmacy*   Lab Work: None If you have labs (blood work) drawn today and your tests are completely normal, you will receive your results only by: MyChart Message (if you have MyChart) OR A paper copy in the mail If you have any lab test that is abnormal or we need to change your treatment, we will call you to review the results.   Testing/Procedures: None   Follow-Up: At [facility], you and your health needs are our priority.  As part of our continuing mission to provide you with exceptional heart care, we have created designated Provider Care Teams.  These Care Teams include your primary Cardiologist (physician) and Advanced Practice Providers (APPs -  Physician Assistants and Nurse  Practitioners) who all work together to provide you with the care you need, when you need it.  Your next appointment:   6 month(s)  Provider:   Thomasene Ripple, DO     Adopting a Healthy Lifestyle.  Know what a healthy weight is for you (roughly BMI <25) and aim to maintain this   Aim for 7+ servings of fruits and vegetables daily   65-80+ fluid ounces of water or unsweet tea for healthy kidneys   Limit to max 1 drink of alcohol per day; avoid smoking/tobacco   Limit animal fats in diet for cholesterol and heart health - choose grass fed whenever available   Avoid highly processed foods, and foods high in saturated/trans fats   Aim for low stress - take time to unwind and care for your mental health   Aim for 150 min of moderate intensity exercise weekly for heart health, and weights twice weekly for bone health   Aim for 7-9 hours of sleep daily   When it comes to diets, agreement about the perfect plan isnt easy to find, even among the experts. Experts at the [facility] of [employer] developed an idea known as the Healthy Eating Plate. Just imagine a plate divided into logical, healthy portions.   The emphasis is on diet quality:   Load up on vegetables and fruits - one-half of your plate: Aim for color and variety, and remember that potatoes dont count.   Go for whole grains - one-quarter of your plate: Whole wheat, barley, wheat berries, quinoa, oats, brown rice, and foods made with them. If you want pasta, go with whole wheat pasta.   Protein power - one-quarter of your plate: Fish, chicken, beans, and nuts are all healthy, versatile protein sources. Limit red meat.   The diet,  however, does go beyond the plate, offering a few other suggestions.   Use healthy plant oils, such as olive, canola, soy, corn, sunflower and peanut. Check the labels, and avoid partially hydrogenated oil, which have unhealthy trans fats.   If youre thirsty, drink water. Coffee and tea  are good in moderation, but skip sugary drinks and limit milk and dairy products to one or two daily servings.   The type of carbohydrate in the diet is more important than the amount. Some sources of carbohydrates, such as vegetables, fruits, whole grains, and beans-are healthier than others.   Finally, stay active  Signed, Thomasene Ripple, DO  06/05/2023 9:34 AM    [facility]

## 2023-06-10 ENCOUNTER — Other Ambulatory Visit (HOSPITAL_BASED_OUTPATIENT_CLINIC_OR_DEPARTMENT_OTHER): Payer: Self-pay

## 2023-06-10 MED ORDER — COVID-19 MRNA VAC-TRIS(PFIZER) 30 MCG/0.3ML IM SUSY
0.3000 mL | PREFILLED_SYRINGE | Freq: Once | INTRAMUSCULAR | 0 refills | Status: AC
Start: 2023-06-10 — End: 2023-06-11
  Filled 2023-06-10: qty 0.3, 1d supply, fill #0

## 2023-06-10 MED ORDER — INFLUENZA VAC A&B SURF ANT ADJ 0.5 ML IM SUSY
0.5000 mL | PREFILLED_SYRINGE | Freq: Once | INTRAMUSCULAR | 0 refills | Status: AC
  Filled 2023-06-10: qty 0.5, 1d supply, fill #0

## 2023-06-10 NOTE — Telephone Encounter (Signed)
Caller & Relationship to patient:  MRN #  [id number]   Call Back Number:   Date of Last Office Visit: 06/03/2023     Date of Next Office Visit: 09/02/2023    Medication(s) to be Refilled: bactrim  Preferred Pharmacy:   ** Please notify patient to allow 48-72 hours to process** **Let patient know to contact pharmacy at the end of the day to make sure medication is ready. ** **If patient has not been seen in a year or longer, book an appointment **Advise to use MyChart for refill requests OR to contact their pharmacy

## 2023-06-11 NOTE — Telephone Encounter (Signed)
Pt called and advised that her Uti never went away and the bactrim did not work. Wants another round to see if it can clear it up. Please advise Kh

## 2023-06-11 NOTE — Telephone Encounter (Signed)
Done [facility]

## 2023-06-11 NOTE — Telephone Encounter (Signed)
Called and sent my chart message. Kh

## 2023-06-17 ENCOUNTER — Ambulatory Visit
Admission: EM | Admit: 2023-06-17 | Discharge: 2023-06-17 | Disposition: A | Discharge status: Home / Self Care | Payer: Medicare Other | Attending: Internal Medicine | Admitting: Internal Medicine

## 2023-06-17 ENCOUNTER — Other Ambulatory Visit: Payer: Self-pay

## 2023-06-17 ENCOUNTER — Institutional Professional Consult (permissible substitution): Payer: Self-pay | Admitting: Clinical

## 2023-06-17 DIAGNOSIS — N3090 Cystitis, unspecified without hematuria: Secondary | ICD-10-CM | POA: Insufficient documentation

## 2023-06-17 LAB — POCT URINALYSIS DIP (MANUAL ENTRY)
Bilirubin, UA: NEGATIVE
Blood, UA: NEGATIVE
Glucose, UA: NEGATIVE mg/dL
Ketones, POC UA: NEGATIVE mg/dL
Leukocytes, UA: NEGATIVE
Nitrite, UA: NEGATIVE
Protein Ur, POC: NEGATIVE mg/dL
Spec Grav, UA: 1.015 (ref 1.010–1.025)
Urobilinogen, UA: 0.2 E.U./dL
pH, UA: 5.5 (ref 5.0–8.0)

## 2023-06-17 MED ORDER — PHENAZOPYRIDINE HCL 100 MG PO TABS
100.0000 mg | ORAL_TABLET | Freq: Three times a day (TID) | ORAL | 0 refills | Status: AC | PRN
Start: 2023-06-17 — End: 2023-06-19

## 2023-06-17 NOTE — Discharge Instructions (Signed)
You may take Pyridium as needed for 2 days to help with urinary symptoms.  This medication will make your urine orange.  The clinic will contact you with results of the urine culture done today if it is positive.  Continue fluids and rest.  Please follow-up with your PCP if your symptoms do not improve.  Please go to the ER if you develop any worsening symptoms.  I hope you feel better soon!

## 2023-06-17 NOTE — ED Provider Notes (Signed)
[facility]    CSN: [id number] Arrival date & time: 06/17/23  1500      History   Chief Complaint Chief Complaint  Patient presents with   Abdominal Pain    HPI Michele [name] is a 72 y.o. female presents for dysuria. Pt reports she was treated with 3 days of bactrim at the beginning of September for a UTI.  She reports she is been treated twice prior to this for UTI as well and states symptoms improved but never completely resolved.  She continues to have suprapubic pressure/pain, burning with urination and urgency.  No fevers, nausea/vomiting, flank pain.  No vaginal discharge or STD concerns.  She has not taken any OTC medications for her symptoms.  No other concerns at this time.   Abdominal Pain Associated symptoms: dysuria     Past Medical History:  Diagnosis Date   Anxiety    Depression    Dysrhythmia    tachy episodes   Endometriosis    Herniated disc    L5-S1   History of kidney stones    History of orthostatic hypotension 08-31-13   History of phlebitis    from IV   Interstitial cystitis    Lumbar spondylosis    Multiple thyroid nodules    Peripheral vascular disease (HCC) 78   phlebitis lft arm- iv site   Phlebitis    Pneumonia    hx   PONV (postoperative nausea and vomiting)    last surgery okay   Shortness of breath    exersion   Spinal stenosis    Stroke (HCC) 5   tia   Tuberculosis    no tb but test pos   Unspecified disorder of adrenal glands    questionable problem to be investigated    Patient Active Problem List   Diagnosis Date Noted   Lipid screening 06/03/2023   Primary hypertension 11/20/2021   SVT (supraventricular tachycardia) 11/20/2021   Coronary artery disease without angina pectoris 08/21/2021   Hyperlipidemia LDL goal <100 08/21/2021   Gait instability 07/19/2021   History of colon polyps 07/19/2021   Lumbar disc disease with radiculopathy 10/03/2015   Anxiety 08/15/2015   OSA (obstructive sleep apnea)  08/15/2015   Prediabetes 01/11/2015   Hashimoto's thyroiditis 12/15/2014   Recurrent nephrolithiasis 12/15/2014   Vitamin D deficiency 12/15/2014   Thyroid nodule 12/16/2013    Class: Chronic   Thyroid neoplasm 12/16/2013   Carpal tunnel syndrome 10/17/2013   Spinal stenosis in cervical region 10/17/2013   Cervicalgia 10/17/2013   Headache(784.0) 10/17/2013   Disturbance of skin sensation 10/17/2013   POTS (postural orthostatic tachycardia syndrome) 10/11/2013   PVC's (premature ventricular contractions) 10/11/2013   TIA (transient ischemic attack) 10/11/2013   History of TIA (transient ischemic attack) 10/11/2013    Past Surgical History:  Procedure Laterality Date   CESAREAN SECTION  1985   LAMINOTOMY  2004   RIGHT INTERLAMINAR LAMINOTOMY, MICRODISKECTOMY, L5-S1    LITHOTRIPSY     NASAL SINUS SURGERY  1996   THYROIDECTOMY Right 12/16/2013   DR SHOEMAKER   THYROIDECTOMY Right 12/16/2013   Procedure: RIGHT THYROIDECTOMY;  Surgeon: Osborn Coho, MD;  Location: [facility] OR;  Service: ENT;  Laterality: Right;   TONSILLECTOMY AND ADENOIDECTOMY  1958   TOTAL ABDOMINAL HYSTERECTOMY W/ BILATERAL SALPINGOOPHORECTOMY  2004   VAGINAL BIRTH AFTER CESAREAN SECTION  1986    OB History   No obstetric history on file.      Home Medications    Prior  to Admission medications   Medication Sig Start Date End Date Taking? Authorizing Provider  phenazopyridine (PYRIDIUM) 100 MG tablet Take 1 tablet (100 mg total) by mouth 3 (three) times daily as needed for up to 2 days for pain. 06/17/23 06/19/23 Yes Radford Pax, NP  ALPRAZolam Prudy Feeler) 0.5 MG tablet Take 1 tablet (0.5 mg total) by mouth 3 (three) times daily as needed. 04/15/23     aspirin EC 81 MG tablet Take 1 tablet (81 mg total) by mouth daily. Swallow whole. 04/22/23   Tobb, Kardie, DO  atorvastatin (LIPITOR) 10 MG tablet Take 1 tablet (10 mg total) by mouth daily. 06/05/23 09/03/23  Tobb, Kardie, DO  cetirizine (ZYRTEC) 10 MG tablet Take  10 mg by mouth as needed. 08/11/15   [provider]  diclofenac Sodium (VOLTAREN) 1 % GEL Apply 2 g topically as needed. 07/08/18   [provider]  diltiazem (CARDIZEM) 120 MG tablet Take 1 tablet (120 mg total) by mouth 4 (four) times daily. 06/05/23   Tobb, Kardie, DO  fexofenadine (ALLEGRA ALLERGY) 180 MG tablet Take 1 tablet (180 mg total) by mouth daily. 06/03/23   Ivonne Andrew, NP  fluticasone (FLONASE) 50 MCG/ACT nasal spray Place 1 spray into both nostrils as needed. 08/11/15   [provider]  HYDROcodone-acetaminophen (NORCO) 10-325 MG tablet Take 1 tablet by mouth every 4 (four) hours as needed for pain. 06/04/23     PARoxetine (PAXIL) 20 MG tablet Take 1 tablet (20 mg total) by mouth daily. 06/04/23       Family History Family History  Problem Relation Age of Onset   Hypertension Mother    Heart disease Mother    Anxiety disorder Mother    Arthritis Mother    Cervical cancer Mother    Prostate cancer Father    Hypertension Father    Heart disease Father    Breast cancer Sister        premenopausal   Hepatitis C Sister    Fibromyalgia Sister    Hypertension Sister    Heart disease Sister    Hemochromatosis Brother    Hypothyroidism Father    Gout Father    Goiter Mother    Hypertension Paternal Grandfather    CVA Paternal Grandfather 70   Heart attack Paternal Grandfather    Heart attack Paternal Grandmother    CVA Paternal Grandmother 45   Hyperlipidemia Paternal Grandmother    Hypertension Paternal Grandmother    Hypertension Maternal Grandfather    Heart attack Maternal Grandfather 75   Hyperlipidemia Maternal Grandfather    Hypertension Maternal Grandmother    Heart attack Maternal Grandmother    Hyperlipidemia Maternal Grandmother    Heart attack Sister 31    Social History Social History   Tobacco Use   Smoking status: Former    Current packs/day: 0.00    Average packs/day: 1 pack/day for 25.0 years (25.0 ttl pk-yrs)     Types: Cigarettes    Start date: 04/14/1987    Quit date: 04/13/2012    Years since quitting: 11.1   Smokeless tobacco: Never   Tobacco comments:    quit july 2013  Substance Use Topics   Alcohol use: No   Drug use: No     Allergies   Iodine, Latex, and Shellfish allergy   Review of Systems Review of Systems  Genitourinary:  Positive for dysuria and frequency.     Physical Exam Triage Vital Signs ED Triage Vitals  Encounter Vitals Group  BP 06/17/23 1518 (!) 159/91     Systolic BP Percentile --      Diastolic BP Percentile --      Pulse Rate 06/17/23 1518 85     Resp 06/17/23 1518 16     Temp 06/17/23 1518 97.7 F (36.5 C)     Temp Source 06/17/23 1518 Oral     SpO2 06/17/23 1518 94 %     Weight --      Height --      Head Circumference --      Peak Flow --      Pain Score 06/17/23 1523 6     Pain Loc --      Pain Education --      Exclude from Growth Chart --    No data found.  Updated Vital Signs BP (!) 159/91 (BP Location: Right Arm)   Pulse 85   Temp 97.7 F (36.5 C) (Oral)   Resp 16   SpO2 94%   Visual Acuity Right Eye Distance:   Left Eye Distance:   Bilateral Distance:    Right Eye Near:   Left Eye Near:    Bilateral Near:     Physical Exam Vitals and nursing note reviewed.  Constitutional:      Appearance: Normal appearance.  HENT:     Head: Normocephalic and atraumatic.  Eyes:     Pupils: Pupils are equal, round, and reactive to light.  Cardiovascular:     Rate and Rhythm: Normal rate.  Pulmonary:     Effort: Pulmonary effort is normal.  Abdominal:     Tenderness: There is no right CVA tenderness or left CVA tenderness.  Skin:    General: Skin is warm and dry.  Neurological:     General: No focal deficit present.     Mental Status: She is alert and oriented to person, place, and time.  Psychiatric:        Mood and Affect: Mood normal.        Behavior: Behavior normal.      UC Treatments / Results  Labs (all labs  ordered are listed, but only abnormal results are displayed) Labs Reviewed  URINE CULTURE  POCT URINALYSIS DIP (MANUAL ENTRY)   Comprehensive metabolic panel Order: [id number] Status: Final result     Visible to patient: Yes (seen)     Next appt: 09/02/2023 at 01:00 PM in Internal Medicine Ivonne Andrew, NP)     Dx: Primary hypertension   2 Result Notes     1 Patient Communication        Component Ref Range & Units 13 d ago (06/04/23) 2 mo ago (03/31/23) 2 mo ago (03/30/23) 2 yr ago (04/12/21) 9 yr ago (12/12/13)  Glucose 70 - 99 mg/dL 409 High  93 94 95 R 811 High   BUN 8 - 27 mg/dL 12 14 10 6  Low  13 R  Creatinine, Ser 0.57 - 1.00 mg/dL 9.14 7.82 9.56 2.13 0.86 R  eGFR >59 mL/min/1.73 76 75 67 83   BUN/Creatinine Ratio 12 - 28 15 17 11  Low  8 Low    Sodium 134 - 144 mmol/L 141 143 140 139 141 R  Potassium 3.5 - 5.2 mmol/L 4.3 5.2 5.9 High Panic  CM 4.3 4.0 R  Chloride 96 - 106 mmol/L 102 105 102 102 105 R  CO2 20 - 29 mmol/L 23 24 28 22 24  R  Calcium 8.7 - 10.3 mg/dL 9.5 9.7 9.7 9.1 9.2  R  Total Protein 6.0 - 8.5 g/dL 7.1  6.9    Albumin 3.8 - 4.8 g/dL 4.7  4.5    Globulin, Total 1.5 - 4.5 g/dL 2.4  2.4    Bilirubin Total 0.0 - 1.2 mg/dL 0.3  0.3    Alkaline Phosphatase 44 - 121 IU/L 68  75    AST 0 - 40 IU/L 16  14    ALT 0 - 32 IU/L 5  8    Resulting Agency [facility]         Narrative Performed by: Verdell Carmine Performed at:  [address] [facility]  [address], [place], [place]  [id number] Lab Director: Jolene Schimke MD, Phone:  [phone]     EKG   Radiology No results found.  Procedures Procedures (including critical care time)  Medications Ordered in UC Medications - No data to display  Initial Impression / Assessment and Plan / UC Course  I have reviewed the triage vital signs and the nursing notes.  Pertinent labs & imaging results that were available during my care of the patient were reviewed  by me and considered in my medical decision making (see chart for details).     Reviewed exam and UA with patient.  UA is negative for UTI, will culture based on symptoms.  UA from a earlier this month did show positive leuks and blood.  Discussed likely interstitial cystitis.  Will do trial of Azo, GFR normal.  Side effect profile reviewed.  Advised continuing rest and fluids.  PCP follow-up if symptoms do not improve.  ER precautions reviewed and patient verbalized understanding. Final Clinical Impressions(s) / UC Diagnoses   Final diagnoses:  Cystitis     Discharge Instructions      You may take Pyridium as needed for 2 days to help with urinary symptoms.  This medication will make your urine orange.  The clinic will contact you with results of the urine culture done today if it is positive.  Continue fluids and rest.  Please follow-up with your PCP if your symptoms do not improve.  Please go to the ER if you develop any worsening symptoms.  I hope you feel better soon!    ED Prescriptions     Medication Sig Dispense Auth. Provider   phenazopyridine (PYRIDIUM) 100 MG tablet Take 1 tablet (100 mg total) by mouth 3 (three) times daily as needed for up to 2 days for pain. 6 tablet Radford Pax, NP      PDMP not reviewed this encounter.   Radford Pax, NP 06/17/23 [phone]

## 2023-06-17 NOTE — ED Triage Notes (Signed)
Pt presents to UC w/ c/o lower abdominal pain. Pt states she's been treated for a UTI with 3 days of bactrim, but sympotms came back worse after finishing course of abx.  Last dose of bactrim was about 2 weeks ago.

## 2023-06-19 LAB — URINE CULTURE: Culture: NO GROWTH

## 2023-07-07 ENCOUNTER — Other Ambulatory Visit (HOSPITAL_BASED_OUTPATIENT_CLINIC_OR_DEPARTMENT_OTHER): Payer: Self-pay

## 2023-07-09 ENCOUNTER — Other Ambulatory Visit (HOSPITAL_BASED_OUTPATIENT_CLINIC_OR_DEPARTMENT_OTHER): Payer: Self-pay

## 2023-07-09 ENCOUNTER — Other Ambulatory Visit: Payer: Self-pay

## 2023-07-09 MED ORDER — HYDROCODONE-ACETAMINOPHEN 10-325 MG PO TABS
1.0000 | ORAL_TABLET | ORAL | 0 refills | Status: DC | PRN
  Filled 2023-07-09: qty 180, 30d supply, fill #0

## 2023-07-09 MED ORDER — ALPRAZOLAM 0.5 MG PO TABS
0.5000 mg | ORAL_TABLET | Freq: Three times a day (TID) | ORAL | 0 refills | Status: DC | PRN
  Filled 2023-07-09: qty 90, 30d supply, fill #0

## 2023-07-20 ENCOUNTER — Ambulatory Visit: Payer: Medicare Other | Admitting: Cardiology

## 2023-07-20 ENCOUNTER — Other Ambulatory Visit (HOSPITAL_BASED_OUTPATIENT_CLINIC_OR_DEPARTMENT_OTHER): Payer: Self-pay

## 2023-07-22 ENCOUNTER — Ambulatory Visit: Payer: Medicare Other | Admitting: Cardiology

## 2023-08-06 ENCOUNTER — Other Ambulatory Visit (HOSPITAL_BASED_OUTPATIENT_CLINIC_OR_DEPARTMENT_OTHER): Payer: Self-pay

## 2023-08-06 MED ORDER — PAROXETINE HCL 10 MG PO TABS
10.0000 mg | ORAL_TABLET | Freq: Every day | ORAL | 1 refills | Status: DC
  Filled 2023-08-06: qty 30, 30d supply, fill #0
  Filled 2023-08-24 – 2023-08-31 (×3): qty 30, 30d supply, fill #1

## 2023-08-06 MED ORDER — HYDROCODONE-ACETAMINOPHEN 10-325 MG PO TABS
1.0000 | ORAL_TABLET | ORAL | 0 refills | Status: DC | PRN
  Filled 2023-08-06: qty 180, 30d supply, fill #0

## 2023-08-24 ENCOUNTER — Other Ambulatory Visit: Payer: Self-pay

## 2023-08-24 ENCOUNTER — Other Ambulatory Visit (HOSPITAL_BASED_OUTPATIENT_CLINIC_OR_DEPARTMENT_OTHER): Payer: Self-pay

## 2023-08-25 ENCOUNTER — Other Ambulatory Visit (HOSPITAL_BASED_OUTPATIENT_CLINIC_OR_DEPARTMENT_OTHER): Payer: Self-pay

## 2023-08-31 ENCOUNTER — Other Ambulatory Visit: Payer: Self-pay

## 2023-09-02 ENCOUNTER — Ambulatory Visit: Payer: Self-pay | Admitting: Nurse Practitioner

## 2023-09-03 ENCOUNTER — Other Ambulatory Visit (HOSPITAL_BASED_OUTPATIENT_CLINIC_OR_DEPARTMENT_OTHER): Payer: Self-pay

## 2023-09-03 MED ORDER — HYDROCODONE-ACETAMINOPHEN 10-325 MG PO TABS
1.0000 | ORAL_TABLET | ORAL | 0 refills | Status: DC | PRN
  Filled 2023-09-03: qty 180, 30d supply, fill #0

## 2023-09-03 MED ORDER — ALPRAZOLAM 0.5 MG PO TABS
0.5000 mg | ORAL_TABLET | Freq: Three times a day (TID) | ORAL | 0 refills | Status: DC | PRN
  Filled 2023-09-03: qty 90, 30d supply, fill #0

## 2023-09-21 ENCOUNTER — Other Ambulatory Visit: Payer: Self-pay

## 2023-09-28 ENCOUNTER — Other Ambulatory Visit (HOSPITAL_BASED_OUTPATIENT_CLINIC_OR_DEPARTMENT_OTHER): Payer: Self-pay

## 2023-09-28 MED ORDER — HYDROCODONE-ACETAMINOPHEN 10-325 MG PO TABS
1.0000 | ORAL_TABLET | ORAL | 0 refills | Status: DC | PRN
Start: 2023-09-28 — End: 2023-10-28
  Filled 2023-10-01: qty 180, 30d supply, fill #0

## 2023-09-28 MED ORDER — PAROXETINE HCL 10 MG PO TABS
10.0000 mg | ORAL_TABLET | Freq: Every day | ORAL | 1 refills | Status: DC
  Filled 2023-09-28: qty 30, 30d supply, fill #0
  Filled 2023-10-29: qty 30, 30d supply, fill #1

## 2023-10-01 ENCOUNTER — Other Ambulatory Visit (HOSPITAL_BASED_OUTPATIENT_CLINIC_OR_DEPARTMENT_OTHER): Payer: Self-pay

## 2023-10-28 ENCOUNTER — Other Ambulatory Visit (HOSPITAL_BASED_OUTPATIENT_CLINIC_OR_DEPARTMENT_OTHER): Payer: Self-pay

## 2023-10-28 MED ORDER — HYDROCODONE-ACETAMINOPHEN 10-325 MG PO TABS
1.0000 | ORAL_TABLET | ORAL | 0 refills | Status: DC | PRN
  Filled 2023-10-29: qty 180, 30d supply, fill #0

## 2023-10-29 ENCOUNTER — Other Ambulatory Visit: Payer: Self-pay

## 2023-10-29 ENCOUNTER — Other Ambulatory Visit (HOSPITAL_BASED_OUTPATIENT_CLINIC_OR_DEPARTMENT_OTHER): Payer: Self-pay

## 2023-11-23 ENCOUNTER — Telehealth: Payer: Self-pay | Admitting: General Practice

## 2023-11-23 NOTE — Telephone Encounter (Signed)
 Copied from CRM [id number]. Topic: General - Other >> Nov 16, 2023  1:58 PM Aletta Edouard wrote: Reason for CRM: patient got a call from [facility] she would like a call back regarding this she stated she was told that her dr gave [facility] her information to follow her for care

## 2023-11-25 ENCOUNTER — Other Ambulatory Visit (HOSPITAL_BASED_OUTPATIENT_CLINIC_OR_DEPARTMENT_OTHER): Payer: Self-pay | Admitting: Physical Medicine and Rehabilitation

## 2023-11-25 ENCOUNTER — Other Ambulatory Visit: Payer: Self-pay

## 2023-11-25 ENCOUNTER — Other Ambulatory Visit (HOSPITAL_BASED_OUTPATIENT_CLINIC_OR_DEPARTMENT_OTHER): Payer: Self-pay

## 2023-11-25 DIAGNOSIS — R52 Pain, unspecified: Secondary | ICD-10-CM

## 2023-11-25 MED ORDER — PAROXETINE HCL 10 MG PO TABS
10.0000 mg | ORAL_TABLET | Freq: Every day | ORAL | 1 refills | Status: DC
  Filled 2023-11-25: qty 30, 30d supply, fill #0
  Filled 2023-12-24: qty 30, 30d supply, fill #1

## 2023-11-25 MED ORDER — ALPRAZOLAM 0.5 MG PO TABS
0.5000 mg | ORAL_TABLET | Freq: Three times a day (TID) | ORAL | 0 refills | Status: DC | PRN
  Filled 2023-11-25: qty 90, 30d supply, fill #0

## 2023-11-25 MED ORDER — HYDROCODONE-ACETAMINOPHEN 10-325 MG PO TABS
1.0000 | ORAL_TABLET | ORAL | 0 refills | Status: DC | PRN
Start: 2023-11-25 — End: 2023-12-07
  Filled 2023-11-25: qty 180, 30d supply, fill #0

## 2023-11-26 ENCOUNTER — Ambulatory Visit (HOSPITAL_BASED_OUTPATIENT_CLINIC_OR_DEPARTMENT_OTHER)
Admission: RE | Admit: 2023-11-26 | Discharge: 2023-11-26 | Disposition: A | Discharge status: Home / Self Care | Source: Ambulatory Visit | Attending: Physical Medicine and Rehabilitation | Admitting: Physical Medicine and Rehabilitation

## 2023-11-26 DIAGNOSIS — R52 Pain, unspecified: Secondary | ICD-10-CM | POA: Insufficient documentation

## 2023-11-27 ENCOUNTER — Telehealth: Payer: Self-pay | Admitting: Cardiology

## 2023-11-27 ENCOUNTER — Other Ambulatory Visit (HOSPITAL_BASED_OUTPATIENT_CLINIC_OR_DEPARTMENT_OTHER): Payer: Self-pay

## 2023-11-27 MED ORDER — ATORVASTATIN CALCIUM 10 MG PO TABS
10.0000 mg | ORAL_TABLET | Freq: Every day | ORAL | 0 refills | Status: DC
  Filled 2023-11-27 – 2023-12-23 (×2): qty 90, 90d supply, fill #0

## 2023-11-27 NOTE — Telephone Encounter (Signed)
*  STAT* If patient is at the pharmacy, call can be transferred to refill team.   1. Which medications need to be refilled? (please list name of each medication and dose if known)   atorvastatin (LIPITOR) 10 MG tablet     4. Which pharmacy/location (including street and city if local pharmacy) is medication to be sent to? [facility]     5. Do they need a 30 day or 90 day supply? 90

## 2023-11-27 NOTE — Telephone Encounter (Signed)
 Pt's medication was sent to pt's pharmacy as requested. Confirmation received.

## 2023-12-07 ENCOUNTER — Encounter: Payer: Self-pay | Admitting: Cardiology

## 2023-12-07 ENCOUNTER — Other Ambulatory Visit (HOSPITAL_BASED_OUTPATIENT_CLINIC_OR_DEPARTMENT_OTHER): Payer: Self-pay

## 2023-12-07 ENCOUNTER — Ambulatory Visit: Payer: Medicare Other | Attending: Cardiology | Admitting: Cardiology

## 2023-12-07 VITALS — BP 124/68 | HR 71 | Ht 63.0 in | Wt 137.8 lb

## 2023-12-07 DIAGNOSIS — I10 Essential (primary) hypertension: Secondary | ICD-10-CM | POA: Diagnosis present

## 2023-12-07 DIAGNOSIS — I2510 Atherosclerotic heart disease of native coronary artery without angina pectoris: Secondary | ICD-10-CM | POA: Diagnosis present

## 2023-12-07 DIAGNOSIS — I493 Ventricular premature depolarization: Secondary | ICD-10-CM

## 2023-12-07 DIAGNOSIS — Z8639 Personal history of other endocrine, nutritional and metabolic disease: Secondary | ICD-10-CM | POA: Diagnosis present

## 2023-12-07 MED ORDER — PROPRANOLOL HCL 40 MG PO TABS
40.0000 mg | ORAL_TABLET | Freq: Three times a day (TID) | ORAL | 3 refills | Status: AC
  Filled 2023-12-07: qty 270, 90d supply, fill #0
  Filled 2024-06-16: qty 270, 90d supply, fill #1
  Filled 2024-09-09: qty 270, 90d supply, fill #2

## 2023-12-07 NOTE — Patient Instructions (Addendum)
 Medication Instructions:  Your physician has recommended you make the following change in your medication:  STOP: Cardizem  START: Propranolol 40 mg every 8 hours *If you need a refill on your cardiac medications before your next appointment, please call your pharmacy*   Lab Work: TSH If you have labs (blood work) drawn today and your tests are completely normal, you will receive your results only by: MyChart Message (if you have MyChart) OR A paper copy in the mail If you have any lab test that is abnormal or we need to change your treatment, we will call you to review the results.   Follow-Up: At [facility], you and your health needs are our priority.  As part of our continuing mission to provide you with exceptional heart care, we have created designated Provider Care Teams.  These Care Teams include your primary Cardiologist (physician) and Advanced Practice Providers (APPs -  Physician Assistants and Nurse Practitioners) who all work together to provide you with the care you need, when you need it.  Your next appointment:   12 week(s)  Provider:   Thomasene Ripple, DO     Other Instructions A referral has been made to [facility].

## 2023-12-08 ENCOUNTER — Encounter: Payer: Self-pay | Admitting: Cardiology

## 2023-12-08 LAB — TSH+T4F+T3FREE
Free T4: 1.14 ng/dL (ref 0.82–1.77)
T3, Free: 2.7 pg/mL (ref 2.0–4.4)
TSH: 1.63 u[IU]/mL (ref 0.450–4.500)

## 2023-12-09 ENCOUNTER — Other Ambulatory Visit (HOSPITAL_BASED_OUTPATIENT_CLINIC_OR_DEPARTMENT_OTHER): Payer: Self-pay

## 2023-12-11 ENCOUNTER — Other Ambulatory Visit (HOSPITAL_BASED_OUTPATIENT_CLINIC_OR_DEPARTMENT_OTHER): Payer: Self-pay

## 2023-12-11 NOTE — Progress Notes (Signed)
 Cardiology Office Note:    Date:  12/11/2023   ID:  Michele [name] 1951-04-16, MRN [id number]  PCP:  Patient, No Pcp Per  Cardiologist:  Thomasene Ripple, DO  Electrophysiologist:  None   Referring MD: No ref. provider found   " I am ok"  History of Present Illness:    Michele [name] is a 73 y.o. female with a hx of mild coronary artery disease hypertension, hyperlipidemia, prediabetes, paroxysmal supraventricular tachycardia, history of partial orthostatic tachycardia syndrome, history of PVCs here today to establish cardiac care.  Since her last visit with me she reports that she has some issues that she believes is stemming from her underlying endocrine conditions. She mentions that she has had increasing heart rate as well.   No chest pain.  The patient has not seen an endocrinologist in over two years due to previous negative experiences.   Past Medical History:  Diagnosis Date   Anxiety    Depression    Dysrhythmia    tachy episodes   Endometriosis    Herniated disc    L5-S1   History of kidney stones    History of orthostatic hypotension 08-31-13   History of phlebitis    from IV   Interstitial cystitis    Lumbar spondylosis    Multiple thyroid nodules    Peripheral vascular disease (HCC) 78   phlebitis lft arm- iv site   Phlebitis    Pneumonia    hx   PONV (postoperative nausea and vomiting)    last surgery okay   Shortness of breath    exersion   Spinal stenosis    Stroke (HCC) 5   tia   Tuberculosis    no tb but test pos   Unspecified disorder of adrenal glands    questionable problem to be investigated    Past Surgical History:  Procedure Laterality Date   CESAREAN SECTION  1985   LAMINOTOMY  2004   RIGHT INTERLAMINAR LAMINOTOMY, MICRODISKECTOMY, L5-S1    LITHOTRIPSY     NASAL SINUS SURGERY  1996   THYROIDECTOMY Right 12/16/2013   DR SHOEMAKER   THYROIDECTOMY Right 12/16/2013   Procedure: RIGHT THYROIDECTOMY;  Surgeon: Osborn Coho, MD;   Location: [facility] OR;  Service: ENT;  Laterality: Right;   TONSILLECTOMY AND ADENOIDECTOMY  1958   TOTAL ABDOMINAL HYSTERECTOMY W/ BILATERAL SALPINGOOPHORECTOMY  2004   VAGINAL BIRTH AFTER CESAREAN SECTION  1986    Current Medications: Current Meds  Medication Sig   ALPRAZolam (XANAX) 0.5 MG tablet Take 1 tablet (0.5 mg total) by mouth 3 (three) times daily as needed.   aspirin EC 81 MG tablet Take 1 tablet (81 mg total) by mouth daily. Swallow whole.   atorvastatin (LIPITOR) 10 MG tablet Take 1 tablet (10 mg total) by mouth daily. Pt must keep upcoming appt in March 2025 with Dr. Servando Salina before anymore refills. Thank you Final Attempt   cetirizine (ZYRTEC) 10 MG tablet Take 10 mg by mouth as needed.   diclofenac Sodium (VOLTAREN) 1 % GEL Apply 2 g topically as needed.   fluticasone (FLONASE) 50 MCG/ACT nasal spray Place 1 spray into both nostrils as needed.   HYDROcodone-acetaminophen (NORCO) 10-325 MG tablet Take 1 tablet by mouth every 4 (four) hours as needed for pain.   PARoxetine (PAXIL) 10 MG tablet Take 1 tablet (10 mg total) by mouth daily.   propranolol (INDERAL) 40 MG tablet Take 1 tablet (40 mg total) by mouth every 8 (eight) hours.   [  DISCONTINUED] ALPRAZolam (XANAX) 0.5 MG tablet Take 1 tablet (0.5 mg total) by mouth 3 (three) times daily as needed.   [DISCONTINUED] diltiazem (CARDIZEM) 120 MG tablet Take 1 tablet (120 mg total) by mouth 4 (four) times daily.     Allergies:   Iodine, Latex, and Shellfish allergy   Social History   Socioeconomic History   Marital status: Widowed    Spouse name: Not on file   Number of children: 2   Years of education: college2   Highest education level: Not on file  Occupational History   Occupation: retired  Tobacco Use   Smoking status: Former    Current packs/day: 0.00    Average packs/day: 1 pack/day for 25.0 years (25.0 ttl pk-yrs)    Types: Cigarettes    Start date: 04/14/1987    Quit date: 04/13/2012    Years since quitting: 11.6    Smokeless tobacco: Never   Tobacco comments:    quit july 2013  Substance and Sexual Activity   Alcohol use: No   Drug use: No   Sexual activity: Not on file  Other Topics Concern   Not on file  Social History Narrative   Not on file   Social Drivers of Health   Financial Resource Strain: High Risk (08/21/2021)   Received from [facility], [facility]   Overall Financial Resource Strain (CARDIA)    Difficulty of Paying Living Expenses: Hard  Food Insecurity: No Food Insecurity (11/20/2021)   Received from [facility], [facility]   Hunger Vital Sign    Worried About Running Out of Food in the Last Year: Never true    Ran Out of Food in the Last Year: Never true  Transportation Needs: No Transportation Needs (08/21/2021)   Received from [employer], [facility]   PRAPARE - Transportation    Lack of Transportation (Medical): No    Lack of Transportation (Non-Medical): No  Physical Activity: Inactive (08/21/2021)   Received from [facility], [facility]   Exercise Vital Sign    Days of Exercise per Week: 0 days    Minutes of Exercise per Session: 0 min  Stress: Stress Concern Present (08/21/2021)   Received from [facility], [facility] - Occupational Stress Questionnaire    Feeling of Stress : Very much  Social Connections: Unknown (01/27/2022)   Received from [facility], [facility]   Social Network    Social Network: Not on file     Family History: The patient's family history includes Anxiety disorder in her mother; Arthritis in her mother; Breast cancer in her sister; CVA (age of onset: 41) in her paternal grandfather; CVA (age of onset: 65) in her paternal grandmother; Cervical cancer in her mother; Fibromyalgia in her sister; Goiter in her mother; Gout in her father; Heart attack in her maternal grandmother, paternal grandfather, and paternal grandmother; Heart attack (age of onset: 5) in her sister;  Heart attack (age of onset: 7) in her maternal grandfather; Heart disease in her father, mother, and sister; Hemochromatosis in her brother; Hepatitis C in her sister; Hyperlipidemia in her maternal grandfather, maternal grandmother, and paternal grandmother; Hypertension in her father, maternal grandfather, maternal grandmother, mother, paternal grandfather, paternal grandmother, and sister; Hypothyroidism in her father; Prostate cancer in her father.  ROS:   Review of Systems  Constitution: Negative for decreased appetite, fever and weight gain.  HENT: Negative for congestion, ear discharge, hoarse voice and sore throat.   Eyes:  Negative for discharge, redness, vision loss in right eye and visual halos.  Cardiovascular: Negative for chest pain, dyspnea on exertion, leg swelling, orthopnea and palpitations.  Respiratory: Negative for cough, hemoptysis, shortness of breath and snoring.   Endocrine: Negative for heat intolerance and polyphagia.  Hematologic/Lymphatic: Negative for bleeding problem. Does not bruise/bleed easily.  Skin: Negative for flushing, nail changes, rash and suspicious lesions.  Musculoskeletal: Negative for arthritis, joint pain, muscle cramps, myalgias, neck pain and stiffness.  Gastrointestinal: Negative for abdominal pain, bowel incontinence, diarrhea and excessive appetite.  Genitourinary: Negative for decreased libido, genital sores and incomplete emptying.  Neurological: Negative for brief paralysis, focal weakness, headaches and loss of balance.  Psychiatric/Behavioral: Negative for altered mental status, depression and suicidal ideas.  Allergic/Immunologic: Negative for HIV exposure and persistent infections.    EKGs/Labs/Other Studies Reviewed:    The following studies were reviewed today:   EKG:  The ekg ordered today demonstrates sinus rhythm with T wave abnormalities in the inferior and anterolateral leads.  Recent Labs: 03/30/2023: Magnesium  2.1 06/04/2023: ALT 5; BUN 12; Creatinine, Ser 0.82; Hemoglobin 14.3; Platelets 286; Potassium 4.3; Sodium 141 12/07/2023: TSH 1.630  Recent Lipid Panel    Component Value Date/Time   CHOL 112 06/04/2023 1052   TRIG 61 06/04/2023 1052   HDL 59 06/04/2023 1052   CHOLHDL 1.9 06/04/2023 1052   LDLCALC 40 06/04/2023 1052    Physical Exam:    VS:  BP 124/68 (BP Location: Right Arm, Patient Position: Sitting, Cuff Size: Normal)   Pulse 71   Ht 5\' 3"  (1.6 m)   Wt 137 lb 12.8 oz (62.5 kg)   SpO2 93%   BMI 24.41 kg/m     Wt Readings from Last 3 Encounters:  12/07/23 137 lb 12.8 oz (62.5 kg)  06/05/23 136 lb 3.2 oz (61.8 kg)  06/03/23 135 lb (61.2 kg)     GEN: Well nourished, well developed in no acute distress HEENT: Normal NECK: No JVD; No carotid bruits LYMPHATICS: No lymphadenopathy CARDIAC: S1S2 noted,RRR, no murmurs, rubs, gallops RESPIRATORY:  Clear to auscultation without rales, wheezing or rhonchi  ABDOMEN: Soft, non-tender, non-distended, +bowel sounds, no guarding. EXTREMITIES: No edema, No cyanosis, no clubbing MUSCULOSKELETAL:  No deformity  SKIN: Warm and dry NEUROLOGIC:  Alert and oriented x 3, non-focal PSYCHIATRIC:  Normal affect, good insight  ASSESSMENT:    1. PVC's (premature ventricular contractions)   2. Primary hypertension   3. History of hypothyroidism   4. Coronary artery disease involving native coronary artery of native heart without angina pectoris     PLAN:    With her mild coronary artery disease-we will transition the patient off Crestor to Lipitor given her prediabetes.  Take Lipitor 10 mg daily with aspirin 81 mg daily.  Symptomatic PVCs and PACs and PSVT.  She feels her underlying thyriod issue is leading to this. No relief with the cardizem. Will stop this and add propanolol 40 mg every 8 hrs   Thyroid Disorder Post-partial thyroidectomy with symptoms suggestive of hormonal imbalance. Previous tests indicated abnormalities not detected  by standard T4 and TSH. - Refer to endocrinology for comprehensive hormonal evaluation.  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 Potential growth could impact hormonal balance and contribute to symptoms. - Refer to endocrinology for evaluation of pituitary and pineal gland cysts.  Endocrinology Referral Has not consulted an endocrinologist in over two years due to prior negative experiences. Acknowledged need for specialist evaluation  to address symptoms and potential hormonal imbalances. - Refer to a new endocrinologist for comprehensive evaluation and management of potential hormonal imbalances  The patient is in agreement with the above plan. The patient left the office in stable condition.  The patient will follow up in   Medication Adjustments/Labs and Tests Ordered: Current medicines are reviewed at length with the patient today.  Concerns regarding medicines are outlined above.  Orders Placed This Encounter  Procedures   TSH+T4F+T3Free   Ambulatory referral to Endocrinology   EKG 12-Lead   Meds ordered this encounter  Medications   propranolol (INDERAL) 40 MG tablet    Sig: Take 1 tablet (40 mg total) by mouth every 8 (eight) hours.    Dispense:  270 tablet    Refill:  3    Patient Instructions  Medication Instructions:  Your physician has recommended you make the following change in your medication:  STOP: Cardizem  START: Propranolol 40 mg every 8 hours *If you need a refill on your cardiac medications before your next appointment, please call your pharmacy*   Lab Work: TSH If you have labs (blood work) drawn today and your tests are completely normal, you will receive your results only by: MyChart Message (if you have MyChart) OR A paper copy in the mail If you have any lab test that is abnormal or we need to change your treatment, we will call you to review the results.   Follow-Up: At [facility], you and your health needs are our priority.  As part of our continuing mission to provide you with exceptional heart care, we have created designated Provider Care Teams.  These Care Teams include your primary Cardiologist (physician) and Advanced Practice Providers (APPs -  Physician Assistants and Nurse Practitioners) who all work together to provide you with the care you need, when you need it.  Your next appointment:   12 week(s)  Provider:   Thomasene Ripple, DO     Other Instructions A referral has been made to [facility].        Adopting a Healthy Lifestyle.  Know what a healthy weight is for you (roughly BMI <25) and aim to maintain this   Aim for 7+ servings of fruits and vegetables daily   65-80+ fluid ounces of water or unsweet tea for healthy kidneys   Limit to max 1 drink of alcohol per day; avoid smoking/tobacco   Limit animal fats in diet for cholesterol and heart health - choose grass fed whenever available   Avoid highly processed foods, and foods high in saturated/trans fats   Aim for low stress - take time to unwind and care for your mental health   Aim for 150 min of moderate intensity exercise weekly for heart health, and weights twice weekly for bone health   Aim for 7-9 hours of sleep daily   When it comes to diets, agreement about the perfect plan isnt easy to find, even among the experts. Experts at the [facility] of [employer] developed an idea known as the Healthy Eating Plate. Just imagine a plate divided into logical, healthy portions.   The emphasis is on diet quality:   Load up on vegetables and fruits - one-half of your plate: Aim for color and variety, and remember that potatoes dont count.   Go for whole grains - one-quarter of your plate: Whole wheat, barley, wheat berries, quinoa, oats, brown rice, and foods made with them. If you want pasta, go  with whole wheat pasta.   Protein power - one-quarter of your plate:  Fish, chicken, beans, and nuts are all healthy, versatile protein sources. Limit red meat.   The diet, however, does go beyond the plate, offering a few other suggestions.   Use healthy plant oils, such as olive, canola, soy, corn, sunflower and peanut. Check the labels, and avoid partially hydrogenated oil, which have unhealthy trans fats.   If youre thirsty, drink water. Coffee and tea are good in moderation, but skip sugary drinks and limit milk and dairy products to one or two daily servings.   The type of carbohydrate in the diet is more important than the amount. Some sources of carbohydrates, such as vegetables, fruits, whole grains, and beans-are healthier than others.   Finally, stay active  Signed, Thomasene Ripple, DO  12/11/2023 7:26 PM    [facility]

## 2023-12-23 ENCOUNTER — Other Ambulatory Visit: Payer: Self-pay

## 2023-12-23 ENCOUNTER — Other Ambulatory Visit (HOSPITAL_BASED_OUTPATIENT_CLINIC_OR_DEPARTMENT_OTHER): Payer: Self-pay

## 2023-12-23 MED ORDER — NALOXONE HCL 4 MG/0.1ML NA LIQD
1.0000 | NASAL | 5 refills | Status: AC | PRN
Start: 2023-12-23 — End: ?
  Filled 2023-12-23: qty 2, 1d supply, fill #0

## 2023-12-23 MED ORDER — HYDROCODONE-ACETAMINOPHEN 10-325 MG PO TABS
1.0000 | ORAL_TABLET | ORAL | 0 refills | Status: DC | PRN
  Filled 2023-12-23: qty 180, 30d supply, fill #0

## 2023-12-24 ENCOUNTER — Other Ambulatory Visit: Payer: Self-pay

## 2023-12-24 ENCOUNTER — Other Ambulatory Visit (HOSPITAL_BASED_OUTPATIENT_CLINIC_OR_DEPARTMENT_OTHER): Payer: Self-pay

## 2024-01-25 ENCOUNTER — Other Ambulatory Visit: Payer: Self-pay

## 2024-01-25 ENCOUNTER — Other Ambulatory Visit (HOSPITAL_BASED_OUTPATIENT_CLINIC_OR_DEPARTMENT_OTHER): Payer: Self-pay

## 2024-01-25 MED ORDER — ALPRAZOLAM 0.5 MG PO TABS
0.5000 mg | ORAL_TABLET | Freq: Three times a day (TID) | ORAL | 0 refills | Status: DC | PRN
Start: 2024-01-25 — End: 2024-03-22
  Filled 2024-01-25: qty 90, 30d supply, fill #0

## 2024-01-25 MED ORDER — HYDROCODONE-ACETAMINOPHEN 10-325 MG PO TABS
1.0000 | ORAL_TABLET | ORAL | 0 refills | Status: DC | PRN
Start: 2024-01-25 — End: 2024-02-23
  Filled 2024-01-25: qty 180, 30d supply, fill #0

## 2024-01-25 MED ORDER — PAROXETINE HCL 10 MG PO TABS
10.0000 mg | ORAL_TABLET | Freq: Every day | ORAL | 1 refills | Status: AC
  Filled 2024-01-25: qty 30, 30d supply, fill #0

## 2024-02-23 ENCOUNTER — Other Ambulatory Visit (HOSPITAL_BASED_OUTPATIENT_CLINIC_OR_DEPARTMENT_OTHER): Payer: Self-pay

## 2024-02-23 MED ORDER — HYDROCODONE-ACETAMINOPHEN 10-325 MG PO TABS
1.0000 | ORAL_TABLET | ORAL | 0 refills | Status: DC | PRN
  Filled 2024-02-23: qty 180, 30d supply, fill #0

## 2024-03-03 ENCOUNTER — Other Ambulatory Visit: Payer: Self-pay | Admitting: Cardiology

## 2024-03-03 ENCOUNTER — Other Ambulatory Visit (HOSPITAL_BASED_OUTPATIENT_CLINIC_OR_DEPARTMENT_OTHER): Payer: Self-pay

## 2024-03-03 MED ORDER — ATORVASTATIN CALCIUM 10 MG PO TABS
10.0000 mg | ORAL_TABLET | Freq: Every day | ORAL | 0 refills | Status: DC
  Filled 2024-03-03 – 2024-03-14 (×2): qty 90, 90d supply, fill #0

## 2024-03-04 ENCOUNTER — Ambulatory Visit: Admitting: Cardiology

## 2024-03-14 ENCOUNTER — Other Ambulatory Visit (HOSPITAL_BASED_OUTPATIENT_CLINIC_OR_DEPARTMENT_OTHER): Payer: Self-pay

## 2024-03-18 ENCOUNTER — Other Ambulatory Visit (HOSPITAL_BASED_OUTPATIENT_CLINIC_OR_DEPARTMENT_OTHER): Payer: Self-pay

## 2024-03-22 ENCOUNTER — Other Ambulatory Visit (HOSPITAL_BASED_OUTPATIENT_CLINIC_OR_DEPARTMENT_OTHER): Payer: Self-pay

## 2024-03-22 MED ORDER — ALPRAZOLAM 0.5 MG PO TABS
0.5000 mg | ORAL_TABLET | Freq: Three times a day (TID) | ORAL | 0 refills | Status: DC | PRN
  Filled 2024-03-22: qty 90, 30d supply, fill #0

## 2024-03-22 MED ORDER — PAROXETINE HCL 10 MG PO TABS
10.0000 mg | ORAL_TABLET | Freq: Every day | ORAL | 1 refills | Status: AC
  Filled 2024-03-22: qty 30, 30d supply, fill #0

## 2024-03-22 MED ORDER — HYDROCODONE-ACETAMINOPHEN 10-325 MG PO TABS
1.0000 | ORAL_TABLET | ORAL | 0 refills | Status: DC | PRN
  Filled 2024-03-22: qty 180, 30d supply, fill #0

## 2024-04-20 ENCOUNTER — Other Ambulatory Visit (HOSPITAL_BASED_OUTPATIENT_CLINIC_OR_DEPARTMENT_OTHER): Payer: Self-pay

## 2024-04-20 MED ORDER — HYDROCODONE-ACETAMINOPHEN 10-325 MG PO TABS
1.0000 | ORAL_TABLET | ORAL | 0 refills | Status: DC | PRN
  Filled 2024-04-20: qty 180, 30d supply, fill #0

## 2024-05-18 ENCOUNTER — Other Ambulatory Visit (HOSPITAL_BASED_OUTPATIENT_CLINIC_OR_DEPARTMENT_OTHER): Payer: Self-pay

## 2024-05-18 ENCOUNTER — Other Ambulatory Visit: Payer: Self-pay

## 2024-05-18 MED ORDER — ALPRAZOLAM 0.5 MG PO TABS
0.5000 mg | ORAL_TABLET | Freq: Three times a day (TID) | ORAL | 1 refills | Status: AC | PRN
  Filled 2024-05-18: qty 90, 30d supply, fill #0

## 2024-05-18 MED ORDER — PAROXETINE HCL 20 MG PO TABS
20.0000 mg | ORAL_TABLET | Freq: Every day | ORAL | 1 refills | Status: AC
  Filled 2024-05-18: qty 30, 30d supply, fill #0

## 2024-05-18 MED ORDER — HYDROCODONE-ACETAMINOPHEN 10-325 MG PO TABS
1.0000 | ORAL_TABLET | ORAL | 0 refills | Status: DC | PRN
  Filled 2024-06-16: qty 180, 30d supply, fill #0

## 2024-05-18 MED ORDER — HYDROCODONE-ACETAMINOPHEN 10-325 MG PO TABS
1.0000 | ORAL_TABLET | ORAL | 0 refills | Status: AC | PRN
  Filled 2024-05-18: qty 180, 30d supply, fill #0

## 2024-06-09 ENCOUNTER — Other Ambulatory Visit: Payer: Self-pay | Admitting: Cardiology

## 2024-06-15 ENCOUNTER — Other Ambulatory Visit (HOSPITAL_BASED_OUTPATIENT_CLINIC_OR_DEPARTMENT_OTHER): Payer: Self-pay

## 2024-06-16 ENCOUNTER — Other Ambulatory Visit (HOSPITAL_BASED_OUTPATIENT_CLINIC_OR_DEPARTMENT_OTHER): Payer: Self-pay

## 2024-06-16 ENCOUNTER — Other Ambulatory Visit: Payer: Self-pay

## 2024-06-17 ENCOUNTER — Encounter: Payer: Self-pay | Admitting: Nurse Practitioner

## 2024-06-17 ENCOUNTER — Ambulatory Visit (INDEPENDENT_AMBULATORY_CARE_PROVIDER_SITE_OTHER): Payer: Self-pay | Admitting: Nurse Practitioner

## 2024-06-17 VITALS — BP 118/64 | HR 65 | Temp 98.5°F | Ht 63.0 in | Wt 131.0 lb

## 2024-06-17 DIAGNOSIS — Z23 Encounter for immunization: Secondary | ICD-10-CM

## 2024-06-17 DIAGNOSIS — Z0000 Encounter for general adult medical examination without abnormal findings: Secondary | ICD-10-CM | POA: Diagnosis not present

## 2024-06-17 DIAGNOSIS — Z1211 Encounter for screening for malignant neoplasm of colon: Secondary | ICD-10-CM | POA: Diagnosis not present

## 2024-06-17 NOTE — Patient Instructions (Signed)
  Michele [name] , Thank you for taking time to come for your Medicare Wellness Visit. I appreciate your ongoing commitment to your health goals. Please review the following plan we discussed and let me know if I can assist you in the future.   These are the goals we discussed:  Goals   None     This is a list of the screening recommended for you and due dates:  Health Maintenance  Topic Date Due   Hepatitis C Screening  Never done   Colon Cancer Screening  Never done   Zoster (Shingles) Vaccine (1 of 2) Never done   Breast Cancer Screening  08/14/2023   Screening for Lung Cancer  04/02/2024   Flu Shot  04/22/2024   COVID-19 Vaccine (8 - 2024-25 season) 05/23/2024   DTaP/Tdap/Td vaccine (2 - Td or Tdap) 11/21/2024   Medicare Annual Wellness Visit  06/17/2025   Pneumococcal Vaccine for age over 73  Completed   DEXA scan (bone density measurement)  Completed   HPV Vaccine  Aged Out   Meningitis B Vaccine  Aged Out

## 2024-06-17 NOTE — Progress Notes (Signed)
 Subjective:    Michele [name] is a 73 y.o. female who presents for a Welcome to Medicare exam.          Objective:    Today's Vitals   06/17/24 1518  BP: 118/64  Pulse: 65  Temp: 98.5 F (36.9 C)  TempSrc: Oral  SpO2: 97%  Weight: 131 lb (59.4 kg)  Height: 5' 3 (1.6 m)  Body mass index is 23.21 kg/m.  Medications Outpatient Encounter Medications as of 06/17/2024  Medication Sig   ALPRAZolam  (XANAX ) 0.5 MG tablet Take 1 tablet (0.5 mg total) by mouth 3 (three) times daily as needed.   aspirin  EC 81 MG tablet Take 1 tablet (81 mg total) by mouth daily. Swallow whole.   atorvastatin  (LIPITOR) 10 MG tablet Take 1 tablet (10 mg total) by mouth daily.   cetirizine (ZYRTEC) 10 MG tablet Take 10 mg by mouth as needed.   diclofenac Sodium (VOLTAREN) 1 % GEL Apply 2 g topically as needed.   fluticasone (FLONASE) 50 MCG/ACT nasal spray Place 1 spray into both nostrils as needed.   HYDROcodone -acetaminophen  (NORCO) 10-325 MG tablet Take 1 tablet by mouth every 4 (four) hours as needed for pain.   HYDROcodone -acetaminophen  (NORCO) 10-325 MG tablet Take 1 tablet by mouth every 4 (four) hours as needed for pain.   HYDROcodone -acetaminophen  (NORCO) 10-325 MG tablet Take 1 tablet by mouth every 4 (four) hours as needed for pain.   naloxone  (NARCAN ) nasal spray 4 mg/0.1 mL Place 1 spray into one nostril as needed. May repeat dose every 2-3 minutes until patient is responsive or EMS arrives.   PARoxetine  (PAXIL ) 10 MG tablet Take 1 tablet (10 mg total) by mouth daily.   PARoxetine  (PAXIL ) 10 MG tablet Take 1 tablet (10 mg total) by mouth daily.   PARoxetine  (PAXIL ) 20 MG tablet Take 1 tablet (20 mg total) by mouth daily.   propranolol  (INDERAL ) 40 MG tablet Take 1 tablet (40 mg total) by mouth every 8 (eight) hours.   No facility-administered encounter medications on file as of 06/17/2024.     History: Past Medical History:  Diagnosis Date   Allergy 1978   Anxiety    Arthritis 30s    Cataract 2016   Depression    Dysrhythmia    tachy episodes   Endometriosis    Heart murmur 1960s   Herniated disc    L5-S1   History of kidney stones    History of orthostatic hypotension 08/31/2013   History of phlebitis    from IV   Hypertension 2020   Interstitial cystitis    Lumbar spondylosis    Multiple thyroid  nodules    Neuromuscular disorder (HCC) 1980   Peripheral vascular disease 09/22/1976   phlebitis lft arm- iv site   Phlebitis    Pneumonia    hx   PONV (postoperative nausea and vomiting)    last surgery okay   Shortness of breath    exersion   Spinal stenosis    Stroke (HCC) 09/23/2003   tia   Tuberculosis    no tb but test pos   Ulcer 2005   Vioxx   Unspecified disorder of adrenal glands    questionable problem to be investigated   Past Surgical History:  Procedure Laterality Date   ABDOMINAL HYSTERECTOMY  2004   Family history and ovarian cysts   CESAREAN SECTION  09/23/1983   EYE SURGERY  2019   LAMINOTOMY  09/22/2002   RIGHT INTERLAMINAR LAMINOTOMY, MICRODISKECTOMY, L5-S1  LITHOTRIPSY     NASAL SINUS SURGERY  09/22/1994   SPINE SURGERY     Two 2004, 2018   THYROIDECTOMY Right 12/16/2013   DR SHOEMAKER   THYROIDECTOMY Right 12/16/2013   Procedure: RIGHT THYROIDECTOMY;  Surgeon: Alm Bouche, MD;  Location: [facility] OR;  Service: ENT;  Laterality: Right;   TONSILLECTOMY AND ADENOIDECTOMY  09/22/1956   TOTAL ABDOMINAL HYSTERECTOMY W/ BILATERAL SALPINGOOPHORECTOMY  09/22/2002   TUBAL LIGATION  1985   VAGINAL BIRTH AFTER CESAREAN SECTION  09/22/1984    Family History  Problem Relation Age of Onset   Hypertension Mother    Heart disease Mother    Anxiety disorder Mother    Arthritis Mother    Cervical cancer Mother    Goiter Mother    Cancer Mother    Prostate cancer Father    Hypertension Father    Heart disease Father    Hypothyroidism Father    Gout Father    Cancer Father    Kidney disease Father    Breast cancer Sister         premenopausal   Hepatitis C Sister    Fibromyalgia Sister    Hypertension Sister    Heart disease Sister    Heart attack Sister 46   Hemochromatosis Brother    Hypertension Paternal Grandfather    CVA Paternal Grandfather 10   Heart attack Paternal Grandfather    Heart attack Paternal Grandmother    CVA Paternal Grandmother 6   Hyperlipidemia Paternal Grandmother    Hypertension Paternal Grandmother    Hypertension Maternal Grandfather    Heart attack Maternal Grandfather 29   Hyperlipidemia Maternal Grandfather    Hypertension Maternal Grandmother    Heart attack Maternal Grandmother    Hyperlipidemia Maternal Grandmother    ADD / ADHD Son    Cancer Sister    Social History   Occupational History   Occupation: retired  Tobacco Use   Smoking status: Former    Current packs/day: 0.00    Average packs/day: 1 pack/day for 25.0 years (25.0 ttl pk-yrs)    Types: Cigarettes    Start date: 04/14/1987    Quit date: 04/13/2012    Years since quitting: 12.1   Smokeless tobacco: Never   Tobacco comments:    quit july 2013  Substance and Sexual Activity   Alcohol use: No   Drug use: No   Sexual activity: Not Currently    Birth control/protection: Post-menopausal    Tobacco Counseling Counseling given: Not Answered Tobacco comments: quit july 2013   Immunizations and Health Maintenance Immunization History  Administered Date(s) Administered   Fluad  Quad(high Dose 65+) 07/06/2019   Fluad  Trivalent(High Dose 65+) 06/10/2023   INFLUENZA, HIGH DOSE SEASONAL PF 07/08/2017, 07/02/2018   Influenza, Quadrivalent, Recombinant, Inj, Pf 07/03/2021   Influenza-Unspecified 06/18/2009, 06/10/2012, 06/27/2013   PFIZER Comirnaty (Gray Top)Covid-19 Tri-Sucrose Vaccine 07/03/2021   PFIZER(Purple Top)SARS-COV-2 Vaccination 11/13/2019, 12/07/2019, 06/26/2020, 12/27/2020   Pfizer(Comirnaty )Fall Seasonal Vaccine 12 years and older 06/10/2023   Pneumococcal Conjugate-13 06/09/2016    Pneumococcal Polysaccharide-23 06/10/2012, 08/02/2018   Tdap 11/22/2014   Unspecified SARS-COV-2 Vaccination 07/02/2022   Health Maintenance Due  Topic Date Due   Hepatitis C Screening  Never done   Colonoscopy  Never done   Zoster Vaccines- Shingrix (1 of 2) Never done   Mammogram  08/14/2023   Lung Cancer Screening  04/02/2024   Influenza Vaccine  04/22/2024   COVID-19 Vaccine (8 - 2024-25 season) 05/23/2024    Activities of Daily Living  06/17/2024    3:25 PM 06/14/2024    8:55 AM  In your present state of health, do you have any difficulty performing the following activities:  Hearing? 1 0  Vision? 1 0  Difficulty concentrating or making decisions? 0 0  Walking or climbing stairs? 0 1  Dressing or bathing? 0 0  Doing errands, shopping? 0 0  Preparing Food and eating ? Y N  Using the Toilet? Y N  In the past six months, have you accidently leaked urine? Y N  Do you have problems with loss of bowel control? N N  Managing your Medications? Y N  Managing your Finances? Y N  Housekeeping or managing your Housekeeping? N Y    Physical Exam   Physical Exam Vitals and nursing note reviewed.  Constitutional:      General: She is not in acute distress.    Appearance: She is well-developed.  Cardiovascular:     Rate and Rhythm: Normal rate and regular rhythm.  Pulmonary:     Effort: Pulmonary effort is normal.     Breath sounds: Normal breath sounds.  Neurological:     Mental Status: She is alert and oriented to person, place, and time.    (optional), or other factors deemed appropriate based on the beneficiary's medical and social history and current clinical standards.   Advanced Directives: Does Patient Have a Medical Advance Directive?: Yes Type of Advance Directive: Healthcare Power of Attorney Does patient want to make changes to medical advance directive?: Yes (Inpatient - patient defers changing a medical advance directive at this time - Information  given) Copy of Healthcare Power of Attorney in Chart?: No - copy requested         Assessment:    This is a routine wellness examination for this patient .   Vision/Hearing screen No results found.   Goals   None     Depression Screen    06/17/2024    3:26 PM 06/17/2024    3:22 PM  PHQ 2/9 Scores  PHQ - 2 Score 1 1     Fall Risk    06/17/2024    3:25 PM  Fall Risk   Falls in the past year? 1  Number falls in past yr: 0  Injury with Fall? 0  Risk for fall due to : No Fall Risks  Follow up Falls evaluation completed    Cognitive Function:        06/17/2024    3:23 PM  6CIT Screen  What Year? 0 points  What month? 0 points  What time? 0 points  Count back from 20 0 points  Months in reverse 0 points  Repeat phrase 0 points  Total Score 0 points    Patient Care Team: Patient, No Pcp Per as PCP - General (General Practice) Tobb, Kardie, DO as PCP - Cardiology (Cardiology)     Plan:     I have personally reviewed and noted the following in the patient's chart:   Medical and social history Use of alcohol, tobacco or illicit drugs  Current medications and supplements including opioid prescriptions. Patient is not currently taking opioid prescriptions. Functional ability and status Nutritional status Physical activity Advanced directives List of other physicians Hospitalizations, surgeries, and ER visits in previous 12 months Vitals Screenings to include cognitive, depression, and falls Referrals and appointments  In addition, I have reviewed and discussed with patient certain preventive protocols, quality metrics, and best practice recommendations. A written personalized care plan  for preventive services as well as general preventive health recommendations were provided to patient.     Michele GORMAN Borer, NP 06/17/2024

## 2024-06-30 ENCOUNTER — Other Ambulatory Visit (HOSPITAL_BASED_OUTPATIENT_CLINIC_OR_DEPARTMENT_OTHER): Payer: Self-pay

## 2024-06-30 ENCOUNTER — Other Ambulatory Visit: Payer: Self-pay | Admitting: Cardiology

## 2024-07-05 ENCOUNTER — Other Ambulatory Visit (HOSPITAL_BASED_OUTPATIENT_CLINIC_OR_DEPARTMENT_OTHER): Payer: Self-pay

## 2024-07-05 MED ORDER — ATORVASTATIN CALCIUM 10 MG PO TABS
10.0000 mg | ORAL_TABLET | Freq: Every day | ORAL | 0 refills | Status: DC
  Filled 2024-07-05: qty 90, 90d supply, fill #0

## 2024-07-13 ENCOUNTER — Other Ambulatory Visit (HOSPITAL_BASED_OUTPATIENT_CLINIC_OR_DEPARTMENT_OTHER): Payer: Self-pay

## 2024-07-13 MED ORDER — HYDROCODONE-ACETAMINOPHEN 10-325 MG PO TABS
1.0000 | ORAL_TABLET | ORAL | 0 refills | Status: DC | PRN
Start: 2024-07-13 — End: 2024-08-10
  Filled 2024-07-14: qty 180, 30d supply, fill #0

## 2024-07-13 MED ORDER — PAROXETINE HCL 20 MG PO TABS
20.0000 mg | ORAL_TABLET | Freq: Every day | ORAL | 1 refills | Status: AC
  Filled 2024-07-13: qty 30, 30d supply, fill #0

## 2024-07-14 ENCOUNTER — Other Ambulatory Visit (HOSPITAL_BASED_OUTPATIENT_CLINIC_OR_DEPARTMENT_OTHER): Payer: Self-pay

## 2024-07-15 ENCOUNTER — Encounter: Payer: Self-pay | Admitting: Nurse Practitioner

## 2024-07-15 ENCOUNTER — Ambulatory Visit (INDEPENDENT_AMBULATORY_CARE_PROVIDER_SITE_OTHER): Payer: Self-pay | Admitting: Nurse Practitioner

## 2024-07-15 ENCOUNTER — Other Ambulatory Visit (HOSPITAL_BASED_OUTPATIENT_CLINIC_OR_DEPARTMENT_OTHER): Payer: Self-pay

## 2024-07-15 VITALS — BP 129/76 | HR 98 | Wt 132.4 lb

## 2024-07-15 DIAGNOSIS — N390 Urinary tract infection, site not specified: Secondary | ICD-10-CM | POA: Diagnosis not present

## 2024-07-15 DIAGNOSIS — I10 Essential (primary) hypertension: Secondary | ICD-10-CM

## 2024-07-15 DIAGNOSIS — Z1329 Encounter for screening for other suspected endocrine disorder: Secondary | ICD-10-CM

## 2024-07-15 DIAGNOSIS — Z1211 Encounter for screening for malignant neoplasm of colon: Secondary | ICD-10-CM

## 2024-07-15 DIAGNOSIS — Z13220 Encounter for screening for lipoid disorders: Secondary | ICD-10-CM

## 2024-07-15 DIAGNOSIS — Z122 Encounter for screening for malignant neoplasm of respiratory organs: Secondary | ICD-10-CM

## 2024-07-15 DIAGNOSIS — R300 Dysuria: Secondary | ICD-10-CM

## 2024-07-15 DIAGNOSIS — Z1231 Encounter for screening mammogram for malignant neoplasm of breast: Secondary | ICD-10-CM

## 2024-07-15 DIAGNOSIS — Z0000 Encounter for general adult medical examination without abnormal findings: Secondary | ICD-10-CM

## 2024-07-15 LAB — POCT URINALYSIS DIP (CLINITEK)
Bilirubin, UA: NEGATIVE
Glucose, UA: NEGATIVE mg/dL
Ketones, POC UA: NEGATIVE mg/dL
Nitrite, UA: NEGATIVE
POC PROTEIN,UA: NEGATIVE
Spec Grav, UA: >=1.03 — AB (ref 1.010–1.025)
Urobilinogen, UA: 0.2 U/dL
pH, UA: 5.5 (ref 5.0–8.0)

## 2024-07-15 MED ORDER — NITROFURANTOIN MONOHYD MACRO 100 MG PO CAPS
100.0000 mg | ORAL_CAPSULE | Freq: Two times a day (BID) | ORAL | 0 refills | Status: AC
  Filled 2024-07-15: qty 14, 7d supply, fill #0

## 2024-07-15 NOTE — Progress Notes (Signed)
 Subjective   Patient ID: Michele [name], female    DOB: 25-Feb-1951, 73 y.o.   MRN: [id number]  Chief Complaint  Patient presents with   Annual Exam    Has been having a greenish, yellow discharge, can't tell if theres an odor. Thinks it may be a bladder infection     Referring provider: No ref. provider found  MARIESHA VENTURELLA is a 73 y.o. female with Past Medical History: 1978: Allergy No date: Anxiety 30s: Arthritis 2016: Cataract No date: Depression No date: Dysrhythmia     Comment:  tachy episodes No date: Endometriosis 1960s: Heart murmur No date: Herniated disc     Comment:  L5-S1 No date: History of kidney stones 08/31/2013: History of orthostatic hypotension No date: History of phlebitis     Comment:  from IV 2020: Hypertension No date: Interstitial cystitis No date: Lumbar spondylosis No date: Multiple thyroid  nodules 1980: Neuromuscular disorder (HCC) 09/22/1976: Peripheral vascular disease     Comment:  phlebitis lft arm- iv site No date: Phlebitis No date: Pneumonia     Comment:  hx No date: PONV (postoperative nausea and vomiting)     Comment:  last surgery okay No date: Shortness of breath     Comment:  exersion No date: Spinal stenosis 09/23/2003: Stroke (HCC)     Comment:  tia No date: Tuberculosis     Comment:  no tb but test pos 2005: Ulcer     Comment:  Vioxx No date: Unspecified disorder of adrenal glands     Comment:  questionable problem to be investigated   HPI  Patient presents today for physical.  She reports that she has been having dysuria and discharge in her urine.  We we will check UA in office today.  Patient does need repeat labs today.  Patient is due for colon cancer screening, we will get his food, mammogram.  We will order these today. Denies f/c/s, n/v/d, hemoptysis, PND, leg swelling Denies chest pain or edema     Allergies  Allergen Reactions   Nsaids Other (See Comments)    GI problems    Iodine Rash   Latex  Rash   Shellfish Allergy Other (See Comments)    Blood pressure drops    Immunization History  Administered Date(s) Administered   Fluad  Quad(high Dose 65+) 07/06/2019   Fluad  Trivalent(High Dose 65+) 06/10/2023   INFLUENZA, HIGH DOSE SEASONAL PF 07/08/2017, 07/02/2018, 06/17/2024   Influenza, Quadrivalent, Recombinant, Inj, Pf 07/03/2021   Influenza-Unspecified 06/18/2009, 06/10/2012, 06/27/2013   PFIZER Comirnaty (Gray Top)Covid-19 Tri-Sucrose Vaccine 07/03/2021   PFIZER(Purple Top)SARS-COV-2 Vaccination 11/13/2019, 12/07/2019, 06/26/2020, 12/27/2020   Pfizer(Comirnaty )Fall Seasonal Vaccine 12 years and older 06/10/2023   Pneumococcal Conjugate-13 06/09/2016   Pneumococcal Polysaccharide-23 06/10/2012, 08/02/2018   Tdap 11/22/2014   Unspecified SARS-COV-2 Vaccination 07/02/2022    Tobacco History: Social History   Tobacco Use  Smoking Status Former   Current packs/day: 0.00   Average packs/day: 1 pack/day for 25.0 years (25.0 ttl pk-yrs)   Types: Cigarettes   Start date: 04/14/1987   Quit date: 04/13/2012   Years since quitting: 12.2  Smokeless Tobacco Never  Tobacco Comments   quit july 2013   Counseling given: Not Answered Tobacco comments: quit july 2013   Outpatient Encounter Medications as of 07/15/2024  Medication Sig   ALPRAZolam  (XANAX ) 0.5 MG tablet Take 1 tablet (0.5 mg total) by mouth 3 (three) times daily as needed.   aspirin  EC 81 MG tablet Take 1 tablet (81 mg total)  by mouth daily. Swallow whole.   atorvastatin  (LIPITOR) 10 MG tablet Take 1 tablet (10 mg total) by mouth daily.   cetirizine (ZYRTEC) 10 MG tablet Take 10 mg by mouth as needed.   diclofenac Sodium (VOLTAREN) 1 % GEL Apply 2 g topically as needed.   fluticasone (FLONASE) 50 MCG/ACT nasal spray Place 1 spray into both nostrils as needed.   HYDROcodone -acetaminophen  (NORCO) 10-325 MG tablet Take 1 tablet by mouth every 4 (four) hours as needed for pain   naloxone  (NARCAN ) nasal spray 4 mg/0.1  mL Place 1 spray into one nostril as needed. May repeat dose every 2-3 minutes until patient is responsive or EMS arrives.   nitrofurantoin, macrocrystal-monohydrate, (MACROBID) 100 MG capsule Take 1 capsule (100 mg total) by mouth 2 (two) times daily for 7 days.   PARoxetine  (PAXIL ) 20 MG tablet Take 1 tablet (20 mg total) by mouth daily.   propranolol  (INDERAL ) 40 MG tablet Take 1 tablet (40 mg total) by mouth every 8 (eight) hours.   HYDROcodone -acetaminophen  (NORCO) 10-325 MG tablet Take 1 tablet by mouth every 4 (four) hours as needed for pain.   HYDROcodone -acetaminophen  (NORCO) 10-325 MG tablet Take 1 tablet by mouth every 4 (four) hours as needed for pain.   PARoxetine  (PAXIL ) 10 MG tablet Take 1 tablet (10 mg total) by mouth daily.   PARoxetine  (PAXIL ) 10 MG tablet Take 1 tablet (10 mg total) by mouth daily.   PARoxetine  (PAXIL ) 20 MG tablet Take 1 tablet (20 mg total) by mouth daily.   No facility-administered encounter medications on file as of 07/15/2024.    Review of Systems  Review of Systems  Constitutional: Negative.   HENT: Negative.    Cardiovascular: Negative.   Gastrointestinal: Negative.   Allergic/Immunologic: Negative.   Neurological: Negative.   Psychiatric/Behavioral: Negative.       Objective:   BP 129/76 (BP Location: Left Arm, Patient Position: Sitting, Cuff Size: Normal)   Pulse 98   Wt 132 lb 6.4 oz (60.1 kg)   SpO2 97%   BMI 23.45 kg/m   Wt Readings from Last 5 Encounters:  07/15/24 132 lb 6.4 oz (60.1 kg)  06/17/24 131 lb (59.4 kg)  12/07/23 137 lb 12.8 oz (62.5 kg)  06/05/23 136 lb 3.2 oz (61.8 kg)  06/03/23 135 lb (61.2 kg)     Physical Exam Vitals and nursing note reviewed.  Constitutional:      General: She is not in acute distress.    Appearance: She is well-developed.  Cardiovascular:     Rate and Rhythm: Normal rate and regular rhythm.  Pulmonary:     Effort: Pulmonary effort is normal.     Breath sounds: Normal breath sounds.   Neurological:     Mental Status: She is alert and oriented to person, place, and time.       Assessment & Plan:   Dysuria -     POCT URINALYSIS DIP (CLINITEK)  Colon cancer screening -     Ambulatory referral to Gastroenterology  Screening for lung cancer -     Ambulatory Referral for Lung Cancer Scre  Encounter for screening mammogram for malignant neoplasm of breast -     3D Screening Mammogram, Left and Right  Thyroid  disorder screen -     TSH  Lipid screening -     Lipid panel  Primary hypertension -     CBC -     Comprehensive metabolic panel with GFR  Routine adult health maintenance -  CBC -     Comprehensive metabolic panel with GFR -     Lipid panel -     TSH -     Ambulatory Referral for Lung Cancer Scre -     Ambulatory referral to Gastroenterology -     3D Screening Mammogram, Left and Right  Urinary tract infection without hematuria, site unspecified -     Urine Culture -     Nitrofurantoin Monohyd Macro; Take 1 capsule (100 mg total) by mouth 2 (two) times daily for 7 days.  Dispense: 14 capsule; Refill: 0     Return in about 6 months (around 01/13/2025).   Bascom GORMAN Borer, NP 07/15/2024

## 2024-07-16 LAB — LIPID PANEL
Chol/HDL Ratio: 2.1 ratio (ref 0.0–4.4)
Cholesterol, Total: 123 mg/dL (ref 100–199)
HDL: 59 mg/dL (ref >39)
LDL Chol Calc (NIH): 49 mg/dL (ref 0–99)
Triglycerides: 74 mg/dL (ref 0–149)
VLDL Cholesterol Cal: 15 mg/dL (ref 5–40)

## 2024-07-16 LAB — COMPREHENSIVE METABOLIC PANEL WITH GFR
ALT: 16 IU/L (ref 0–32)
AST: 27 IU/L (ref 0–40)
Albumin: 4.4 g/dL (ref 3.8–4.8)
Alkaline Phosphatase: 71 IU/L (ref 49–135)
BUN/Creatinine Ratio: 18 (ref 12–28)
BUN: 17 mg/dL (ref 8–27)
Bilirubin Total: 0.5 mg/dL (ref 0.0–1.2)
CO2: 24 mmol/L (ref 20–29)
Calcium: 9.5 mg/dL (ref 8.7–10.3)
Chloride: 98 mmol/L (ref 96–106)
Creatinine, Ser: 0.95 mg/dL (ref 0.57–1.00)
Globulin, Total: 2.5 g/dL (ref 1.5–4.5)
Glucose: 84 mg/dL (ref 70–99)
Potassium: 4.5 mmol/L (ref 3.5–5.2)
Sodium: 137 mmol/L (ref 134–144)
Total Protein: 6.9 g/dL (ref 6.0–8.5)
eGFR: 63 mL/min/1.73 (ref >59)

## 2024-07-16 LAB — CBC
Hematocrit: 42.9 % (ref 34.0–46.6)
Hemoglobin: 14.4 g/dL (ref 11.1–15.9)
MCH: 32.7 pg (ref 26.6–33.0)
MCHC: 33.6 g/dL (ref 31.5–35.7)
MCV: 98 fL — ABNORMAL HIGH (ref 79–97)
Platelets: 294 x10E3/uL (ref 150–450)
RBC: 4.4 x10E6/uL (ref 3.77–5.28)
RDW: 13.2 % (ref 11.7–15.4)
WBC: 9.3 x10E3/uL (ref 3.4–10.8)

## 2024-07-16 LAB — TSH: TSH: 5.01 u[IU]/mL — ABNORMAL HIGH (ref 0.450–4.500)

## 2024-07-17 LAB — URINE CULTURE

## 2024-07-20 ENCOUNTER — Ambulatory Visit: Payer: Self-pay | Admitting: Nurse Practitioner

## 2024-07-20 DIAGNOSIS — R7989 Other specified abnormal findings of blood chemistry: Secondary | ICD-10-CM

## 2024-08-10 ENCOUNTER — Other Ambulatory Visit (HOSPITAL_BASED_OUTPATIENT_CLINIC_OR_DEPARTMENT_OTHER): Payer: Self-pay

## 2024-08-10 MED ORDER — HYDROCODONE-ACETAMINOPHEN 10-325 MG PO TABS
1.0000 | ORAL_TABLET | ORAL | 0 refills | Status: DC | PRN
  Filled 2024-08-11: qty 180, 30d supply, fill #0

## 2024-08-11 ENCOUNTER — Ambulatory Visit: Admitting: Physician Assistant

## 2024-08-11 ENCOUNTER — Ambulatory Visit: Payer: Self-pay

## 2024-08-11 ENCOUNTER — Encounter: Payer: Self-pay | Admitting: Physician Assistant

## 2024-08-11 ENCOUNTER — Other Ambulatory Visit (HOSPITAL_BASED_OUTPATIENT_CLINIC_OR_DEPARTMENT_OTHER): Payer: Self-pay

## 2024-08-11 ENCOUNTER — Other Ambulatory Visit (HOSPITAL_COMMUNITY)
Admission: RE | Admit: 2024-08-11 | Discharge: 2024-08-11 | Disposition: A | Discharge status: Home / Self Care | Source: Ambulatory Visit | Attending: Physician Assistant | Admitting: Physician Assistant

## 2024-08-11 VITALS — BP 144/87 | HR 75 | Ht 63.0 in | Wt 133.0 lb

## 2024-08-11 DIAGNOSIS — N760 Acute vaginitis: Secondary | ICD-10-CM | POA: Diagnosis present

## 2024-08-11 DIAGNOSIS — B9689 Other specified bacterial agents as the cause of diseases classified elsewhere: Secondary | ICD-10-CM | POA: Diagnosis present

## 2024-08-11 LAB — POCT URINALYSIS DIP (CLINITEK)
Bilirubin, UA: NEGATIVE
Blood, UA: NEGATIVE
Glucose, UA: NEGATIVE mg/dL
Ketones, POC UA: NEGATIVE mg/dL
Nitrite, UA: NEGATIVE
POC PROTEIN,UA: NEGATIVE
Spec Grav, UA: 1.025 (ref 1.010–1.025)
Urobilinogen, UA: 0.2 U/dL
pH, UA: 6.5 (ref 5.0–8.0)

## 2024-08-11 MED ORDER — METRONIDAZOLE 500 MG PO TABS
500.0000 mg | ORAL_TABLET | Freq: Two times a day (BID) | ORAL | 0 refills | Status: AC
  Filled 2024-08-11: qty 14, 7d supply, fill #0

## 2024-08-11 NOTE — Telephone Encounter (Signed)
 Just FYI.

## 2024-08-11 NOTE — Patient Instructions (Signed)
 VISIT SUMMARY:  During today's visit, we discussed your ongoing urinary symptoms and reviewed your history of cardiac issues. We also addressed your general health maintenance needs, including pending tests and upcoming treatments.  YOUR PLAN:  -ACUTE VAGINITIS WITH PERSISTENT VAGINAL DISCHARGE: Acute vaginitis is an inflammation of the vagina that can cause discharge, itching, and pain. You have been prescribed metronidazole to treat bacterial vaginitis. We have also sent your urine for culture to rule out a urinary tract infection.  Vaginal Infection (Bacterial Vaginosis): What to Know  Bacterial vaginosis is an infection of the vagina. It happens when the balance of normal germs (bacteria) in the vagina changes. It's common among females ages 47 to 72. If left untreated, it can increase your risk of getting a sexually transmitted infection (STI). If you're pregnant, you need to get treated right away. This infection can cause a baby to be born early or at a low birth weight. What are the causes? This happens when too many harmful germs grow in the vagina. The exact reason why this happens isn't known. You can't get this infection from toilet seats, bedding, swimming pools, or contact with objects around you. What increases the risk? Having new or multiple sexual partners, or unprotected sex. Douching. Using an intrauterine device (IUD). Smoking. Alcohol and drug abuse. Taking certain antibiotics. Being pregnant. You can get a vaginal infection without being sexually active. However, it most often occurs in sexually active females. What are the signs or symptoms? Some females have no symptoms. If you have symptoms, they may include: [place] or white vaginal discharge. It can be watery or foamy. A fish-like smell, especially after sex or during your menstrual period. Itching in and around the vagina. Burning or pain with peeing. How is this diagnosed? This infection is diagnosed based  on: Your medical history. A physical exam of the vagina. Checking a sample of vaginal fluid for harmful bacteria or uncommon cells. How is this treated? This condition is treated with antibiotics. These may be given as: A pill. A cream for your vagina. A medicine that you put into your vagina called a suppository. If the infection comes back, you may need more antibiotics. Follow these instructions at home: Medicines Take your medicines only as told. Take or apply your antibiotics as told. Do not stop using them even if you start to feel better. General instructions If you have a female sexual partner, tell her about the infection. She should see her health care provider. Female partners don't need treatment. Avoid sex until treatment is complete. Drink more fluids as told. Keep the area around your vagina and rectum clean. Wash the area daily with warm water. Wipe yourself from front to back after pooping. If you're breastfeeding, talk to your provider about continuing during treatment. How is this prevented? Self-care Do not douche or use vaginal deodorant sprays. Douching can upset the balance of good and harmful bacteria in the vagina, which can cause an infection to happen again. Wear cotton or cotton-lined underwear. Avoid wearing tight pants or pantyhose, especially in the summer. Safe sex Use condoms correctly and every time you have sex. Use dental dams to protect yourself during oral sex. Limit the number of sexual partners. Get tested for STIs. Your sexual partner should also get tested. Drugs and alcohol Do not smoke, vape, or use nicotine or tobacco. Do not use drugs. Limit the amount of alcohol you drink because it can lead to risky sexual behavior. Where to find more information  To learn more: Go to tonerpromos.no. Click Health Topics A-Z. Type bacterial vaginosis in the search box. American Sexual Health Association (ASHA): ashasexualhealth.org U.S. Department of  Health and Health And Safety Inspector, Office on Women's Health: travellesson.ca Contact a health care provider if: Your symptoms don't get better, even after treatment. You have more discharge or pain when peeing. You have a fever or chills. You have pain in your belly or pelvis. You have pain during sex. You have vaginal bleeding between menstrual periods. This information is not intended to replace advice given to you by your health care provider. Make sure you discuss any questions you have with your health care provider. Document Revised: 02/25/2023 Document Reviewed: 02/25/2023 Elsevier Patient Education  2024 [employer].

## 2024-08-11 NOTE — Progress Notes (Signed)
 Established Patient Office Visit  Subjective   Patient ID: Michele [name], female    DOB: 1951-01-19  Age: 73 y.o. MRN: [id number]  Chief Complaint  Patient presents with   Dysuria    She reports she has some discomfort when she urinates and a yellow greenish discharge. She states she was recently on a antibiotic for a UTI and did finish the antibiotics   Discussed the use of AI scribe software for clinical note transcription with the patient, who gave verbal consent to proceed.  History of Present Illness   Michele [name] is a 73 year old female who presents with persistent urinary symptoms despite prior treatment for a urinary tract infection.  On July 15, 2024, she experienced a greenish-yellowish discharge and dysuria. Urinalysis showed leukocytes and blood, but no nitrites. She completed a course of Macrobid, but the urine culture showed no bacterial growth. Currently, dysuria has improved, and discharge continues without change. Occasional urinary incontinence occurs, requiring incontinence pads. She has switched to all cotton underwear. There is no pruritus, abdominal pain, fever, chills, nausea, or vomiting. The discharge remains thick and yellowish-green.    She has not been sexually active since her husband's death in 2007-09-05.  Denies any other potential STI symptoms     Past Medical History:  Diagnosis Date   Allergy 1977-09-04   Anxiety    Arthritis 30s   Cataract Sep 05, 2015   Depression    Dysrhythmia    tachy episodes   Endometriosis    Heart murmur 1960s   Herniated disc    L5-S1   History of kidney stones    History of orthostatic hypotension 08/31/2013   History of phlebitis    from IV   Hypertension 09/05/19   Interstitial cystitis    Lumbar spondylosis    Multiple thyroid  nodules    Neuromuscular disorder (HCC) 1980   Peripheral vascular disease 09/22/1976   phlebitis lft arm- iv site   Phlebitis    Pneumonia    hx   PONV (postoperative nausea and vomiting)     last surgery okay   Shortness of breath    exersion   Spinal stenosis    Stroke (HCC) 09/23/2003   tia   Tuberculosis    no tb but test pos   Ulcer September 04, 2004   Vioxx   Unspecified disorder of adrenal glands    questionable problem to be investigated   Social History   Socioeconomic History   Marital status: Widowed    Spouse name: Not on file   Number of children: 2   Years of education: college2   Highest education level: Professional school degree (e.g., MD, DDS, DVM, JD)  Occupational History   Occupation: retired  Tobacco Use   Smoking status: Former    Current packs/day: 0.00    Average packs/day: 1 pack/day for 25.0 years (25.0 ttl pk-yrs)    Types: Cigarettes    Start date: 04/14/1987    Quit date: 04/13/2012    Years since quitting: 12.3   Smokeless tobacco: Never   Tobacco comments:    quit july 2013  Substance and Sexual Activity   Alcohol use: No   Drug use: No   Sexual activity: Not Currently    Birth control/protection: Post-menopausal  Other Topics Concern   Not on file  Social History Narrative   Not on file   Social Drivers of Health   Financial Resource Strain: Low Risk  (06/14/2024)   Overall Physicist, Medical Strain (  CARDIA)    Difficulty of Paying Living Expenses: Not hard at all  Food Insecurity: Food Insecurity Present (06/14/2024)   Hunger Vital Sign    Worried About Running Out of Food in the Last Year: Sometimes true    Ran Out of Food in the Last Year: Sometimes true  Transportation Needs: Unmet Transportation Needs (06/14/2024)   PRAPARE - Transportation    Lack of Transportation (Medical): Yes    Lack of Transportation (Non-Medical): Yes  Physical Activity: Insufficiently Active (06/14/2024)   Exercise Vital Sign    Days of Exercise per Week: 1 day    Minutes of Exercise per Session: 30 min  Stress: No Stress Concern Present (06/14/2024)   [employer] of Occupational Health - Occupational Stress Questionnaire    Feeling of  Stress: Only a little  Social Connections: Moderately Integrated (06/14/2024)   Social Connection and Isolation Panel    Frequency of Communication with Friends and Family: More than three times a week    Frequency of Social Gatherings with Friends and Family: More than three times a week    Attends Religious Services: More than 4 times per year    Active Member of [employer] or Organizations: Yes    Attends Banker Meetings: More than 4 times per year    Marital Status: Widowed  Intimate Partner Violence: Not At Risk (06/17/2024)   Humiliation, Afraid, Rape, and Kick questionnaire    Fear of Current or Ex-Partner: No    Emotionally Abused: No    Physically Abused: No    Sexually Abused: No   Family History  Problem Relation Age of Onset   Hypertension Mother    Heart disease Mother    Anxiety disorder Mother    Arthritis Mother    Cervical cancer Mother    Goiter Mother    Cancer Mother    Prostate cancer Father    Hypertension Father    Heart disease Father    Hypothyroidism Father    Gout Father    Cancer Father    Kidney disease Father    Breast cancer Sister        premenopausal   Hepatitis C Sister    Fibromyalgia Sister    Hypertension Sister    Heart disease Sister    Heart attack Sister 60   Hemochromatosis Brother    Hypertension Paternal Grandfather    CVA Paternal Grandfather 16   Heart attack Paternal Grandfather    Heart attack Paternal Grandmother    CVA Paternal Grandmother 53   Hyperlipidemia Paternal Grandmother    Hypertension Paternal Grandmother    Hypertension Maternal Grandfather    Heart attack Maternal Grandfather 33   Hyperlipidemia Maternal Grandfather    Hypertension Maternal Grandmother    Heart attack Maternal Grandmother    Hyperlipidemia Maternal Grandmother    ADD / ADHD Son    Cancer Sister    Allergies  Allergen Reactions   Nsaids Other (See Comments)    GI problems    Iodine Rash   Latex Rash   Shellfish Allergy  Other (See Comments)    Blood pressure drops    Review of Systems  Constitutional:  Negative for chills and fever.  HENT: Negative.    Eyes: Negative.   Respiratory:  Negative for shortness of breath.   Cardiovascular:  Negative for chest pain.  Gastrointestinal:  Negative for nausea and vomiting.  Genitourinary:  Negative for dysuria and hematuria.  Musculoskeletal: Negative.   Skin: Negative.  Neurological: Negative.   Endo/Heme/Allergies: Negative.   Psychiatric/Behavioral: Negative.        Objective:     BP (!) 144/87 (BP Location: Left Arm, Patient Position: Sitting, Cuff Size: Normal)   Pulse 75   Ht 5' 3 (1.6 m)   Wt 133 lb (60.3 kg)   SpO2 96%   BMI 23.56 kg/m  BP Readings from Last 3 Encounters:  08/11/24 (!) 144/87  07/15/24 129/76  06/17/24 118/64   Wt Readings from Last 3 Encounters:  08/11/24 133 lb (60.3 kg)  07/15/24 132 lb 6.4 oz (60.1 kg)  06/17/24 131 lb (59.4 kg)    Physical Exam Vitals and nursing note reviewed.  Constitutional:      Appearance: Normal appearance.  HENT:     Head: Normocephalic and atraumatic.     Right Ear: External ear normal.     Left Ear: External ear normal.     Nose: Nose normal.     Mouth/Throat:     Mouth: Mucous membranes are moist.     Pharynx: Oropharynx is clear.  Eyes:     Extraocular Movements: Extraocular movements intact.     Conjunctiva/sclera: Conjunctivae normal.     Pupils: Pupils are equal, round, and reactive to light.  Cardiovascular:     Rate and Rhythm: Normal rate and regular rhythm.     Pulses: Normal pulses.     Heart sounds: Normal heart sounds.  Pulmonary:     Effort: Pulmonary effort is normal.     Breath sounds: Normal breath sounds.  Abdominal:     General: Abdomen is flat.     Tenderness: There is no abdominal tenderness. There is no right CVA tenderness or left CVA tenderness.  Musculoskeletal:        General: Normal range of motion.     Cervical back: Normal range of motion  and neck supple.  Skin:    General: Skin is warm and dry.  Neurological:     General: No focal deficit present.     Mental Status: She is alert and oriented to person, place, and time.  Psychiatric:        Mood and Affect: Mood normal.        Behavior: Behavior normal.        Thought Content: Thought content normal.        Judgment: Judgment normal.       Assessment & Plan:   Problem List Items Addressed This Visit   None Visit Diagnoses       Bacterial vaginitis    -  Primary   Relevant Medications   metroNIDAZOLE (FLAGYL) 500 MG tablet   Other Relevant Orders   POCT URINALYSIS DIP (CLINITEK) (Completed)   Urine Culture     Assessment and Plan Acute vaginitis with persistent vaginal discharge Persistent yellowish-green discharge. Urinalysis negative for UTI - Prescribed metronidazole for bacterial vaginitis. - Sent urine for culture to rule out UTI.  The patient was given clear instructions to go to ER or return to medical center if symptoms don't improve, worsen or new problems develop. The patient verbalized understanding. The patient was told to call to get lab results if they haven't heard anything in the next week.    Return if symptoms worsen or fail to improve.    Kirk RAMAN Mayers, PA-C

## 2024-08-11 NOTE — Telephone Encounter (Signed)
 FYI Only or Action Required?: FYI only for provider: Advised mobile unit.  Patient was last seen in primary care on 07/15/2024 by Oley Bascom RAMAN, NP.  Called Nurse Triage reporting Hip Pain and Vaginal Discharge.  Symptoms began a week ago.  Interventions attempted: Prescription medications: macrobid.  Symptoms are: gradually worsening.  Triage Disposition: See PCP When Office is Open (Within 3 Days)  Patient/caregiver understands and will follow disposition?:   Copied from CRM #[id number]. Topic: Clinical - Red Word Triage >> Aug 11, 2024  9:26 AM Victoria B wrote: Patient has severe pain in left hip affecting walking and has yellow green discharge still from UTI after taking antibiotics Reason for Disposition  Abnormal color vaginal discharge (i.e., yellow, green, gray)  Answer Assessment - Initial Assessment Questions Pt noted to have seen PCP on 10/24 for the yellow green vaginal discharge and dx with UTI. Prescribed macrobid, completed course around 11/12. Pt reports return of same discharge a few days after completing abxs. Denies pain burning or itching of vaginal area. Denies bloody or cloudy urine, urine is orange and concentrated. Has chronic back pain, reports increased pain in left low back 2 days ago. Denies fever or abdominal pain. No appt availability in office in pt region in next 3 days, advised mobile unit today. Pt agreeable to go.   1. DISCHARGE: Describe the discharge. (e.g., white, yellow, green, gray, foamy, cottage cheese-like)     Green yellow discharge  2. ODOR: Is there a bad odor?     Denies  3. ONSET: When did the discharge begin?     2-3 days after finishing macrobid for recent UTI, around 11/12.   4. RASH: Is there a rash in the genital area? If Yes, ask: Describe it. (e.g., redness, blisters, sores, bumps)     Denies  5. ABDOMEN PAIN: Are you having any abdomen pain? If Yes, ask: What does it feel like?  (e.g., crampy, dull,  intermittent, constant)      Denies  6. ABDOMEN PAIN SEVERITY: If present, ask: How bad is it? (e.g., Scale 1-10; mild, moderate, or severe)     Denies  7. CAUSE: What do you think is causing the discharge? Have you had the same problem before? What happened then?     Possibly UTI. Pt went 10/24 to PCP for UTI symptoms and yellow green vaginal discharge. Prescribed macrobid, completed course around 11/12. Onset of same discharge a few days after completing abxs.  8. OTHER SYMPTOMS: Do you have any other symptoms? (e.g., fever, itching, urination pain, vaginal bleeding, vaginal foreign body)     Pt reports chronic low back pain, reports new onset sharp pain left lower back, onset 2 days ago. Denies burning or itching or fever. Denies pain or burning with urination. Denies blood in urine or cloudiness. Reports concentrated urine.  Protocols used: Vaginal Discharge-A-AH

## 2024-08-11 NOTE — Addendum Note (Signed)
 Addended by: MAYERS, Wafa Martes S on: 08/11/2024 02:43 PM   Modules accepted: Orders

## 2024-08-11 NOTE — Telephone Encounter (Signed)
 FYI please see note pt advised to go to the mobile unit. KH

## 2024-08-12 ENCOUNTER — Encounter (HOSPITAL_BASED_OUTPATIENT_CLINIC_OR_DEPARTMENT_OTHER): Payer: Self-pay | Admitting: Physical Medicine and Rehabilitation

## 2024-08-12 LAB — CERVICOVAGINAL ANCILLARY ONLY
Bacterial Vaginitis (gardnerella): NEGATIVE
Candida Glabrata: NEGATIVE
Candida Vaginitis: NEGATIVE
Comment: NEGATIVE
Comment: NEGATIVE
Comment: NEGATIVE

## 2024-08-13 LAB — URINE CULTURE

## 2024-08-15 ENCOUNTER — Other Ambulatory Visit (HOSPITAL_BASED_OUTPATIENT_CLINIC_OR_DEPARTMENT_OTHER): Payer: Self-pay | Admitting: Physical Medicine and Rehabilitation

## 2024-08-15 ENCOUNTER — Encounter (HOSPITAL_BASED_OUTPATIENT_CLINIC_OR_DEPARTMENT_OTHER): Payer: Self-pay | Admitting: Physical Medicine and Rehabilitation

## 2024-08-15 ENCOUNTER — Ambulatory Visit (HOSPITAL_BASED_OUTPATIENT_CLINIC_OR_DEPARTMENT_OTHER)
Admission: RE | Admit: 2024-08-15 | Discharge: 2024-08-15 | Disposition: A | Discharge status: Home / Self Care | Source: Ambulatory Visit | Attending: Physical Medicine and Rehabilitation | Admitting: Physical Medicine and Rehabilitation

## 2024-08-15 ENCOUNTER — Ambulatory Visit: Payer: Self-pay | Admitting: Physician Assistant

## 2024-08-15 DIAGNOSIS — M25562 Pain in left knee: Secondary | ICD-10-CM | POA: Insufficient documentation

## 2024-08-15 DIAGNOSIS — M25551 Pain in right hip: Secondary | ICD-10-CM | POA: Insufficient documentation

## 2024-08-15 NOTE — Progress Notes (Signed)
 Pt is aware of lab results.

## 2024-08-22 ENCOUNTER — Telehealth: Payer: Self-pay | Admitting: Cardiology

## 2024-08-22 NOTE — Telephone Encounter (Signed)
 Patient reports right leg pain for the past month, it is constant and feels like a cramping sensation. It is present at rest and worse when standing/walking. The pain is worse at right shin (just below the knee) and on the inside of her right leg above the knee.  She had x-rays performed and states they showed calcified blood clots, and she is concerned there may be other blood clots. X-rays available for review in chart.  Patient states she has SOB that has been present for a couple years, worse with activity but occasionally present at rest as well.  She states she has noticed poor circulation in her feet/legs. My toenails quit growing. She denies any discoloration to foot and leg. She states she always has swelling in her legs.  Patient is asking if she needs a scan of her lungs to check for blood clots, or if she needs to see a vascular surgeon. She is asking for recommendations from Dr. Sheena.  Will forward to Dr. Sheena to review and advise.

## 2024-08-22 NOTE — Telephone Encounter (Signed)
 Pt states that after having severe leg pain, she had xrays that showed calcified blood clots. Pt is concerned there may be clots in other parts of her body as well. She would advise on a treatment plan. Please advise.

## 2024-08-23 ENCOUNTER — Emergency Department (HOSPITAL_BASED_OUTPATIENT_CLINIC_OR_DEPARTMENT_OTHER)

## 2024-08-23 ENCOUNTER — Encounter (HOSPITAL_BASED_OUTPATIENT_CLINIC_OR_DEPARTMENT_OTHER): Payer: Self-pay

## 2024-08-23 ENCOUNTER — Ambulatory Visit: Payer: Self-pay

## 2024-08-23 ENCOUNTER — Emergency Department (HOSPITAL_BASED_OUTPATIENT_CLINIC_OR_DEPARTMENT_OTHER): Admission: EM | Admit: 2024-08-23 | Discharge: 2024-08-23 | Disposition: A | Discharge status: Home / Self Care

## 2024-08-23 ENCOUNTER — Other Ambulatory Visit: Payer: Self-pay

## 2024-08-23 ENCOUNTER — Inpatient Hospital Stay (HOSPITAL_BASED_OUTPATIENT_CLINIC_OR_DEPARTMENT_OTHER)
Admission: RE | Admit: 2024-08-23 | Discharge: 2024-08-23 | Disposition: A | Source: Ambulatory Visit | Attending: Nurse Practitioner

## 2024-08-23 DIAGNOSIS — M79604 Pain in right leg: Secondary | ICD-10-CM

## 2024-08-23 DIAGNOSIS — I2510 Atherosclerotic heart disease of native coronary artery without angina pectoris: Secondary | ICD-10-CM | POA: Insufficient documentation

## 2024-08-23 DIAGNOSIS — Z91040 Latex allergy status: Secondary | ICD-10-CM | POA: Diagnosis not present

## 2024-08-23 DIAGNOSIS — Z7982 Long term (current) use of aspirin: Secondary | ICD-10-CM | POA: Insufficient documentation

## 2024-08-23 DIAGNOSIS — Z87891 Personal history of nicotine dependence: Secondary | ICD-10-CM | POA: Diagnosis not present

## 2024-08-23 DIAGNOSIS — M79661 Pain in right lower leg: Secondary | ICD-10-CM | POA: Diagnosis present

## 2024-08-23 DIAGNOSIS — Z0000 Encounter for general adult medical examination without abnormal findings: Secondary | ICD-10-CM | POA: Diagnosis present

## 2024-08-23 DIAGNOSIS — Z1231 Encounter for screening mammogram for malignant neoplasm of breast: Secondary | ICD-10-CM | POA: Diagnosis present

## 2024-08-23 NOTE — Telephone Encounter (Signed)
 FYI Only or Action Required?: FYI only for provider: ED advised.  Patient was last seen in primary care on 08/11/2024 by Mayers, Kirk RAMAN, PA-C.  Called Nurse Triage reporting Leg Pain.  Symptoms began about a month ago.  Interventions attempted: Rest, hydration, or home remedies.  Symptoms are: gradually worsening.  Triage Disposition: See HCP Within 4 Hours (Or PCP Triage)  Patient/caregiver understands and will follow disposition?: Yes  Copied from CRM #[id number]. Topic: Clinical - Red Word Triage >> Aug 23, 2024 11:09 AM Myrick T wrote: Red Word that prompted transfer to Nurse Triage: patient thinks she has a blood clot in her right calf area. The area is swollen and painful. Reason for Disposition  History of prior blood clot in leg or lungs (i.e., deep vein thrombosis, pulmonary embolism)  Answer Assessment - Initial Assessment Questions Pains on the right side of her body since October-  Xray- calcified blood clots in hip- Needs hip replacement Pins and needles in the right leg- like waking up- only on right leg Pain not getting better  Phelbitis in the 70's , Hx TIA's, terrible [place] Channel score Knee on the right side worse then left- heating pad  Denies hot hard knot No color changes or temp changes.  Advised ED for assessment of RLE and rule out TIA/CVA versus blood clot versus cord impingment 1. ONSET: When did the pain start?      october 2. LOCATION: Where is the pain located?      Right leg  3. PAIN: How bad is the pain?    (Scale 1-10; or mild, moderate, severe)     Moderate- severe 4. WORK OR EXERCISE: Has there been any recent work or exercise that involved this part of the body?      Flight to [place]  in October- pain started then  5. CAUSE: What do you think is causing the leg pain?     Blood clot? 6. OTHER SYMPTOMS: Do you have any other symptoms? (e.g., chest pain, back pain, breathing difficulty, swelling, rash, fever, numbness, weakness)      Denies new SOB, CP, Dizziness, fever-- endorses RLE paresthesias  Protocols used: Leg Pain-A-AH

## 2024-08-23 NOTE — ED Triage Notes (Signed)
 Pain and swelling  in right calf x 1 month

## 2024-08-23 NOTE — Discharge Instructions (Addendum)
 Your ultrasound today does not show any evidence of a DVT, or blood clot, in your right leg.  Your symptoms may be reflective of inadequate blood flow to your legs, I recommend that you follow-up with a vascular specialist to assess your symptoms further.  I have provided you with the contact information for a vascular surgeon, please contact their office to schedule follow-up in regard to your symptoms.  Return to the emergency department if your symptoms worsen.  Follow-up with your PCP as needed.

## 2024-08-23 NOTE — ED Provider Notes (Signed)
 [facility] Provider Note   CSN: [id number] Arrival date & time: 08/23/24  1419     Patient presents with: Leg Pain   Michele [name] is a 73 y.o. female.   73 year old female presenting with right lower extremity pain.  Patient notes cramping and pins-and-needles like sensation in her right lower extremity, primarily in the lateral aspect of her right calf and posterior aspect of her right thigh, that has been worsening since October.  She notes that she visited her sister in [place]  during that time and was doing much more walking/activity than she was used to, she noted that her symptoms were exacerbated by walking but are constant regardless of her activity status.  She endorses similar symptoms in her left lower extremity but reports that her right lower extremity symptoms are more bothersome.  She has used a heating pad as well as lidocaine  patches which do provide some relief of her symptoms.  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  She has been told that she has severe atherosclerotic disease but has never been diagnosed with peripheral arterial disease, she has not seen a vascular specialist.  She has no other complaints at this time.   Leg Pain      Prior to Admission medications   Medication Sig Start Date End Date Taking? Authorizing Provider  ALPRAZolam  (XANAX ) 0.5 MG tablet Take 1 tablet (0.5 mg total) by mouth 3 (three) times daily as needed. 05/18/24     aspirin  EC 81 MG tablet Take 1 tablet (81 mg total) by mouth daily. Swallow whole. 04/22/23   Tobb, Kardie, DO  atorvastatin  (LIPITOR) 10 MG tablet Take 1 tablet (10 mg total) by mouth daily. 07/05/24   Tobb, Kardie, DO  cetirizine (ZYRTEC) 10 MG tablet Take 10 mg by mouth as needed. 08/11/15   [provider]  diclofenac Sodium (VOLTAREN) 1 % GEL Apply  2 g topically as needed. 07/08/18   [provider]  fluticasone (FLONASE) 50 MCG/ACT nasal spray Place 1 spray into both nostrils as needed. 08/11/15   [provider]  HYDROcodone -acetaminophen  (NORCO) 10-325 MG tablet Take 1 tablet by mouth every 4 (four) hours as needed for pain. 06/04/23     HYDROcodone -acetaminophen  (NORCO) 10-325 MG tablet Take 1 tablet by mouth every 4 (four) hours as needed for pain. 05/18/24     HYDROcodone -acetaminophen  (NORCO) 10-325 MG tablet Take 1 tablet by mouth every 4 (four) hours as needed for pain. 08/10/24     naloxone  (NARCAN ) nasal spray 4 mg/0.1 mL Place 1 spray into one nostril as needed. May repeat dose every 2-3 minutes until patient is responsive or EMS arrives. 12/23/23     PARoxetine  (PAXIL ) 10 MG tablet Take 1 tablet (10 mg total) by mouth daily. 01/25/24     PARoxetine  (PAXIL ) 10 MG tablet Take 1 tablet (10 mg total) by mouth daily. 03/22/24     PARoxetine  (PAXIL ) 20 MG tablet Take 1 tablet (20 mg total) by mouth daily. 05/18/24     PARoxetine  (PAXIL ) 20 MG tablet Take 1 tablet (20 mg total) by mouth daily. 07/13/24     propranolol  (INDERAL ) 40 MG tablet Take 1 tablet (40 mg total) by mouth every 8 (eight) hours. 12/07/23   Tobb, Kardie, DO    Allergies: Nsaids, Iodine, Latex, and Shellfish allergy  Review of Systems  Updated Vital Signs  Vitals:   08/23/24 1425 08/23/24 1426  BP: 136/85   Pulse: 71   Resp: 19   Temp: 98.5 F (36.9 C)   SpO2: 96%   Weight:  59 kg     Physical Exam Vitals and nursing note reviewed.  HENT:     Head: Normocephalic.  Eyes:     Extraocular Movements: Extraocular movements intact.  Cardiovascular:     Rate and Rhythm: Normal rate.     Pulses:          Dorsalis pedis pulses are 2+ on the right side and 2+ on the left side.       Posterior tibial pulses are 2+ on the right side and 2+ on the left side.  Pulmonary:     Effort: Pulmonary effort is normal.  Musculoskeletal:     Cervical back:  Normal range of motion.     Comments: Moves all extremities spontaneously without difficulty LE's: Tenderness to palpation of posterior calves bilaterally, R>L.  No appreciable erythema/warmth/swelling bilaterally, LE's are symmetric in appearance.  Patient notes diminished sensation to lateral aspect of the right calf and posterior thigh.  Skin:    General: Skin is warm and dry.     Comments: Skin of lower extremities is shiny/hairless. Normal temperature bilaterally. No ulcerations or disruption in skin integrity.  Neurological:     Mental Status: She is alert and oriented to person, place, and time.     (all labs ordered are listed, but only abnormal results are displayed) Labs Reviewed - No data to display  EKG: None  Radiology: US  Venous Img Lower Unilateral Right Result Date: 08/23/2024 CLINICAL DATA:  Right lower extremity pain EXAM: RIGHT LOWER EXTREMITY VENOUS DOPPLER ULTRASOUND TECHNIQUE: Gray-scale sonography with compression, as well as color and duplex ultrasound, were performed to evaluate the deep venous system(s) from the level of the common femoral vein through the popliteal and proximal calf veins. COMPARISON:  None Available. FINDINGS: VENOUS Normal compressibility of the common femoral, superficial femoral, and popliteal veins, as well as the visualized calf veins. Visualized portions of profunda femoral vein and great saphenous vein unremarkable. No filling defects to suggest DVT on grayscale or color Doppler imaging. Doppler waveforms show normal direction of venous flow, normal respiratory plasticity and response to augmentation. Limited views of the contralateral common femoral vein are unremarkable. OTHER None. Limitations: none IMPRESSION: Negative. Electronically Signed   By: Wilkie Lent M.D.   On: 08/23/2024 15:51     Procedures   Medications Ordered in the ED - No data to display  Clinical Course as of 08/23/24 1634  Tue Aug 23, 2024  1631 Evaluated.   Well-appearing.  Warm well-perfused.  History sounds like peripheral artery disease/claudication.  Exam not consistent with acute limb ischemia.  Appropriate for further management outpatient.  Will give vascular surgery number. [TY]    Clinical Course User Index [TY] Neysa Caron PARAS, DO                                 Medical Decision Making This patient presents to the ED for concern of leg pain, this involves an extensive number of treatment options, and is a complaint that carries with it a high risk of complications and morbidity.  The differential diagnosis includes musculoskeletal pain/strain/sprain, muscle cramping, DVT, acute limb ischemia, peripheral arterial disease   Co morbidities that complicate the  patient evaluation  History of coronary artery disease, hyperlipidemia    Additional history obtained:  Additional history obtained from record review External records from outside source obtained and reviewed including telephone communication with PCPs office today     Imaging Studies ordered:  I ordered imaging studies including right lower extremity DVT ultrasound I independently visualized and interpreted imaging which showed Negative I agree with the radiologist interpretation   Cardiac Monitoring: / EKG:  The patient was maintained on a cardiac monitor.  I personally viewed and interpreted the cardiac monitored which showed an underlying rhythm of: NSR    Social Determinants of Health:  Former tobacco use, unmet transportation needs   Test / Admission - Considered:  Physical exam is notable as above, patient's legs are without appreciable erythema/swelling/warmth, legs are symmetric in appearance, legs are not cool to the touch but are shiny/hairless bilaterally, normal pulses bilaterally.  She does note diminished sensation to the right lateral calf and right posterior thigh as compared to the left lower extremity.  She is able to ambulate on her own  without difficulty but frequently uses a cane or rollator walker at home. DVT ultrasound of the right lower extremity is negative as above. Patient's symptoms today align with a diagnosis of peripheral arterial disease.  She complains of worsening pain with ambulation, this is consistent with claudication.  She also notes sensory deficits as well as frequent pins-and-needles sensation to the right lower extremity specifically.  At this time she does not have any signs/symptoms that would raise concern for acute limb ischemia, however I do feel that it would be beneficial for her to be further evaluated by a vascular specialist in regard to this.  I discussed the signs/symptoms of acute limb ischemia in depth with the patient, she understands that if any of the symptoms occur she needs to return to the emergency department.  I provided her with the contact information for a vascular specialist, she plans to contact their office to schedule follow-up.  She is appropriate for discharge at this time.   Staffed with Dr. Neysa who independently evaluated this patient at the bedside        Final diagnoses:  Right leg pain    ED Discharge Orders     None          Glendia Rocky SAILOR, PA-C 08/23/24 1656    Neysa Caron PARAS, DO 08/23/24 1914

## 2024-08-25 NOTE — Telephone Encounter (Signed)
Patient has been seen in the ER 

## 2024-08-29 ENCOUNTER — Other Ambulatory Visit (HOSPITAL_BASED_OUTPATIENT_CLINIC_OR_DEPARTMENT_OTHER): Payer: Self-pay

## 2024-08-29 MED ORDER — PREGABALIN 75 MG PO CAPS
75.0000 mg | ORAL_CAPSULE | Freq: Two times a day (BID) | ORAL | 0 refills | Status: DC
  Filled 2024-08-29: qty 60, 30d supply, fill #0

## 2024-08-30 ENCOUNTER — Other Ambulatory Visit (HOSPITAL_BASED_OUTPATIENT_CLINIC_OR_DEPARTMENT_OTHER): Payer: Self-pay

## 2024-08-31 ENCOUNTER — Other Ambulatory Visit (HOSPITAL_BASED_OUTPATIENT_CLINIC_OR_DEPARTMENT_OTHER): Payer: Self-pay

## 2024-09-01 ENCOUNTER — Other Ambulatory Visit (HOSPITAL_BASED_OUTPATIENT_CLINIC_OR_DEPARTMENT_OTHER): Payer: Self-pay

## 2024-09-02 ENCOUNTER — Other Ambulatory Visit (HOSPITAL_BASED_OUTPATIENT_CLINIC_OR_DEPARTMENT_OTHER): Payer: Self-pay

## 2024-09-07 ENCOUNTER — Encounter: Payer: Self-pay | Admitting: Emergency Medicine

## 2024-09-07 ENCOUNTER — Other Ambulatory Visit (HOSPITAL_BASED_OUTPATIENT_CLINIC_OR_DEPARTMENT_OTHER): Payer: Self-pay

## 2024-09-07 ENCOUNTER — Other Ambulatory Visit: Payer: Self-pay

## 2024-09-07 MED ORDER — HYDROCODONE-ACETAMINOPHEN 10-325 MG PO TABS
1.0000 | ORAL_TABLET | ORAL | 0 refills | Status: DC | PRN
  Filled 2024-09-07 – 2024-09-08 (×2): qty 180, 30d supply, fill #0

## 2024-09-07 MED ORDER — PAROXETINE HCL 20 MG PO TABS
20.0000 mg | ORAL_TABLET | Freq: Every day | ORAL | 1 refills | Status: AC
  Filled 2024-09-07: qty 30, 30d supply, fill #0

## 2024-09-07 MED FILL — Prednisone Tab 10 MG: ORAL | 10 days supply | Qty: 30 | Fill #0 | Status: AC

## 2024-09-08 ENCOUNTER — Other Ambulatory Visit (HOSPITAL_BASED_OUTPATIENT_CLINIC_OR_DEPARTMENT_OTHER): Payer: Self-pay | Admitting: Physical Medicine and Rehabilitation

## 2024-09-08 ENCOUNTER — Other Ambulatory Visit (HOSPITAL_BASED_OUTPATIENT_CLINIC_OR_DEPARTMENT_OTHER): Payer: Self-pay

## 2024-09-08 DIAGNOSIS — M5416 Radiculopathy, lumbar region: Secondary | ICD-10-CM

## 2024-09-09 ENCOUNTER — Other Ambulatory Visit: Payer: Self-pay

## 2024-09-09 ENCOUNTER — Other Ambulatory Visit (HOSPITAL_BASED_OUTPATIENT_CLINIC_OR_DEPARTMENT_OTHER): Payer: Self-pay

## 2024-09-09 ENCOUNTER — Telehealth: Payer: Self-pay | Admitting: Acute Care

## 2024-09-09 DIAGNOSIS — Z122 Encounter for screening for malignant neoplasm of respiratory organs: Secondary | ICD-10-CM

## 2024-09-09 DIAGNOSIS — Z87891 Personal history of nicotine dependence: Secondary | ICD-10-CM

## 2024-09-09 NOTE — Telephone Encounter (Signed)
 Lung Cancer Screening Narrative/Criteria Questionnaire (Cigarette Smokers Only- No Cigars/Pipes/vapes)   Michele [name]   SDMV:09/20/24@130pm [place]                                           1951/05/07               LDCT: 10/19/24@2pm /[facility]    73 y.o.   Phone: [phone]  Lung Screening Narrative (confirm age 73-77 yrs Medicare / 50-80 yrs Private pay insurance)   Insurance information:medicare   Referring Provider:Nichols   This screening involves an initial phone call with a team member from our program. It is called a shared decision making visit. The initial meeting is required by insurance and Medicare to make sure you understand the program. This appointment takes about 15-20 minutes to complete. The CT scan will completed at a separate date/time. This scan takes about 5-10 minutes to complete and you may eat and drink before and after the scan.  Criteria questions for Lung Cancer Screening:   Are you a current or former smoker? Former Age began smoking: 73yo   If you are a former smoker, what year did you quit smoking? 2013   To calculate your smoking history, I need an accurate estimate of how many packs of cigarettes you smoked per day and for how many years. (Not just the number of PPD you are now smoking)   Years smoking 26 x Packs per day 1 = Pack years 26   (at least 20 pack yrs)   (Make sure they understand that we need to know how much they have smoked in the past, not just the number of PPD they are smoking now)  Do you have a personal history of cancer?  No    Do you have a family history of cancer? Yes  (cancer type and and relative) mother/cervical:  father/prostate:   sister/breast  Are you coughing up blood?  No  Have you had unexplained weight loss of 15 lbs or more in the last 6 months? No  It looks like you meet all criteria.     Additional information: N/A

## 2024-09-11 ENCOUNTER — Ambulatory Visit (HOSPITAL_BASED_OUTPATIENT_CLINIC_OR_DEPARTMENT_OTHER)
Admission: RE | Admit: 2024-09-11 | Discharge: 2024-09-11 | Disposition: A | Discharge status: Home / Self Care | Source: Ambulatory Visit | Attending: Physical Medicine and Rehabilitation | Admitting: Physical Medicine and Rehabilitation

## 2024-09-11 DIAGNOSIS — M5416 Radiculopathy, lumbar region: Secondary | ICD-10-CM | POA: Insufficient documentation

## 2024-09-11 MED ORDER — GADOBUTROL 1 MMOL/ML IV SOLN
6.0000 mL | Freq: Once | INTRAVENOUS | Status: AC | PRN
  Administered 2024-09-11: 6 mL via INTRAVENOUS

## 2024-09-20 ENCOUNTER — Encounter: Payer: Self-pay | Admitting: Physician Assistant

## 2024-09-20 ENCOUNTER — Ambulatory Visit: Admitting: Acute Care

## 2024-09-20 DIAGNOSIS — Z122 Encounter for screening for malignant neoplasm of respiratory organs: Secondary | ICD-10-CM

## 2024-09-20 DIAGNOSIS — Z87891 Personal history of nicotine dependence: Secondary | ICD-10-CM

## 2024-09-20 NOTE — Patient Instructions (Signed)
 Thank you for participating in the  Lung Cancer Screening Program. It was our pleasure to meet you today. We will call you with the results of your scan within the next few days. Your scan will be assigned a Lung RADS category score by the physicians reading the scans.  This Lung RADS score determines follow up scanning.  See below for description of categories, and follow up screening recommendations. We will be in touch to schedule your follow up screening annually or based on recommendations of our providers. We will fax a copy of your scan results to your Primary Care Physician, or the physician who referred you to the program, to ensure they have the results. Please call the office if you have any questions or concerns regarding your scanning experience or results.  Our office number is [phone]. Please speak with Karna Curly, RN., Karna Doom RN, or [facility] RN, and Isaiah Dover RN. They are  our Lung Cancer Screening RN.'s If They are unavailable when you call, Please leave a message on the voice mail. We will return your call at our earliest convenience.This voice mail is monitored several times a day.  Remember, if your scan is normal, we will scan you annually as long as you continue to meet the criteria for the program. (Age 64-80, Current smoker or smoker who has quit within the last 15 years). If you are a smoker, remember, quitting is the single most powerful action that you can take to decrease your risk of lung cancer and other pulmonary, breathing related problems. We know quitting is hard, and we are here to help.  Please let us  know if there is anything we can do to help you meet your goal of quitting. If you are a former smoker, counselling psychologist. We are proud of you! Remain smoke free! Remember you can refer friends or family members through the number above.  We will screen them to make sure they meet criteria for the program. Thank you for helping us   take better care of you by participating in Lung Screening.  For Virtual Smoking Cessation Classes , The American Lung Association Provides  Freedom From Smoking Classes.  Please search their website for dates and times.    Lung RADS Categories:  Lung RADS 1: no nodules or definitely non-concerning nodules.  Recommendation is for a repeat annual scan in 12 months.  Lung RADS 2:  nodules that are non-concerning in appearance and behavior with a very low likelihood of becoming an active cancer. Recommendation is for a repeat annual scan in 12 months.  Lung RADS 3: nodules that are probably non-concerning , includes nodules with a low likelihood of becoming an active cancer.  Recommendation is for a 60-month repeat screening scan. Often noted after an upper respiratory illness. We will be in touch to make sure you have no questions, and to schedule your 75-month scan.  Lung RADS 4 A: nodules with concerning findings, recommendation is most often for a follow up scan in 3 months or additional testing based on our provider's assessment of the scan. We will be in touch to make sure you have no questions and to schedule the recommended 3 month follow up scan.  Lung RADS 4 B:  indicates findings that are concerning. We will be in touch with you to schedule additional diagnostic testing based on our provider's  assessment of the scan.  You can receive free nicotine replacement therapy ( patches, gum or mints) by calling 1-800-QUIT NOW.  Please call so we can get you on the path to becoming  a non-smoker. I know it is hard, but you can do this!  Other options for assistance in smoking cessation ( As covered by your insurance benefits)  Hypnosis for smoking cessation  [employer] [phone]  Acupuncture for smoking cessation  [facility] [phone]    Your CT scan is scheduled for 10/19/2024 at 2 pm at [facility].

## 2024-09-20 NOTE — Progress Notes (Signed)
 Virtual Visit via Telephone Note  I connected with Rock JINNY Lowe on 09/20/2024 at  1:30 PM EST by telephone and verified that I am speaking with the correct person using two identifiers.  Location: Patient: At home, in [place]  Provider: [address], [place], [place], [address]    I discussed the limitations, risks, security and privacy concerns of performing an evaluation and management service by telephone and the availability of in person appointments. I also discussed with the patient that there may be a patient responsible charge related to this service. The patient expressed understanding and agreed to proceed.  Shared Decision Making Visit Lung Cancer Screening Program [phone])   Eligibility: Age 73 y.o. Pack Years Smoking History Calculation 26 pack years  (# packs/per year x # years smoked) Recent History of coughing up blood  no Unexplained weight loss? no ( >Than 15 pounds within the last 6 months ) Prior History Lung / other cancer no (Diagnosis within the last 5 years already requiring surveillance chest CT Scans). Smoking Status Former Smoker Former Smokers: Years since quit: 13 years  Quit Date: 2013  Visit Components: Discussion included one or more decision making aids. yes Discussion included risk/benefits of screening. yes Discussion included potential follow up diagnostic testing for abnormal scans. yes Discussion included meaning and risk of over diagnosis. yes Discussion included meaning and risk of False Positives. yes Discussion included meaning of total radiation exposure. yes  Counseling Included: Importance of adherence to annual lung cancer LDCT screening. yes Impact of comorbidities on ability to participate in the program. yes Ability and willingness to under diagnostic treatment. yes  Smoking Cessation Counseling: Current Smokers:  Discussed importance of smoking cessation. N/A Information about tobacco cessation classes and interventions  provided to patient. N/A Patient provided with ticket for LDCT Scan. N/A Symptomatic Patient. N/A  Counseling Diagnosis Code: Tobacco Use Z72.0 Asymptomatic Patient N/A  Counseling  Former Smokers:  Discussed the importance of maintaining cigarette abstinence. yes Diagnosis Code: Personal History of Nicotine Dependence. S12.108 Information about tobacco cessation classes and interventions provided to patient. Yes Patient provided with ticket for LDCT Scan. N/A Written Order for Lung Cancer Screening with LDCT placed in Epic. Yes (CT Chest Lung Cancer Screening Low Dose W/O CM) [id number] Z12.2-Screening of respiratory organs Z87.891-Personal history of nicotine dependence  Shared decision visit completed by Wells Georgia, FNP as a registered nurse awaiting credentialing.    Wells CHRISTELLA Georgia, FNP

## 2024-10-05 ENCOUNTER — Other Ambulatory Visit (HOSPITAL_BASED_OUTPATIENT_CLINIC_OR_DEPARTMENT_OTHER): Payer: Self-pay

## 2024-10-05 ENCOUNTER — Other Ambulatory Visit: Payer: Self-pay | Admitting: Cardiology

## 2024-10-05 MED ORDER — ATORVASTATIN CALCIUM 10 MG PO TABS
10.0000 mg | ORAL_TABLET | Freq: Every day | ORAL | 0 refills | Status: AC
  Filled 2024-10-05: qty 90, 90d supply, fill #0

## 2024-10-05 NOTE — Telephone Encounter (Signed)
 Lipid panel done 07/15/24

## 2024-10-06 ENCOUNTER — Other Ambulatory Visit: Payer: Self-pay | Admitting: Medical Genetics

## 2024-10-06 ENCOUNTER — Other Ambulatory Visit: Payer: Self-pay

## 2024-10-06 ENCOUNTER — Other Ambulatory Visit (HOSPITAL_BASED_OUTPATIENT_CLINIC_OR_DEPARTMENT_OTHER): Payer: Self-pay

## 2024-10-06 MED ORDER — PREGABALIN 75 MG PO CAPS
75.0000 mg | ORAL_CAPSULE | Freq: Two times a day (BID) | ORAL | 0 refills | Status: AC
  Filled 2024-10-06 – 2024-10-10 (×3): qty 60, 30d supply, fill #0

## 2024-10-06 MED ORDER — HYDROCODONE-ACETAMINOPHEN 10-325 MG PO TABS
1.0000 | ORAL_TABLET | ORAL | 0 refills | Status: AC | PRN
  Filled 2024-10-06: qty 180, 30d supply, fill #0

## 2024-10-06 MED ORDER — PAROXETINE HCL 10 MG PO TABS
10.0000 mg | ORAL_TABLET | Freq: Every day | ORAL | 1 refills | Status: AC
  Filled 2024-10-06: qty 30, 30d supply, fill #0

## 2024-10-07 ENCOUNTER — Other Ambulatory Visit (HOSPITAL_BASED_OUTPATIENT_CLINIC_OR_DEPARTMENT_OTHER): Payer: Self-pay

## 2024-10-10 ENCOUNTER — Other Ambulatory Visit (HOSPITAL_BASED_OUTPATIENT_CLINIC_OR_DEPARTMENT_OTHER): Payer: Self-pay

## 2024-10-10 ENCOUNTER — Other Ambulatory Visit

## 2024-10-10 ENCOUNTER — Other Ambulatory Visit: Payer: Self-pay

## 2024-10-13 ENCOUNTER — Telehealth: Payer: Self-pay | Admitting: Nurse Practitioner

## 2024-10-13 DIAGNOSIS — T8789 Other complications of amputation stump: Secondary | ICD-10-CM

## 2024-10-13 DIAGNOSIS — R600 Localized edema: Secondary | ICD-10-CM

## 2024-10-13 NOTE — Progress Notes (Signed)
 Virtual Visit via Video Note  I connected with Michele [name] on 10/13/24 at 11:00 AM EST by a video enabled telemedicine application and verified that I am speaking with the correct person using two identifiers.  Location: Patient: home Provider: office   I discussed the limitations of evaluation and management by telemedicine and the availability of in person appointments. The patient expressed understanding and agreed to proceed.  History of Present Illness:  Patient presents today through video visit for referral request.  Patient would like to request a referral to vascular.  She did recently go to the ED because she was worried she had a DVT.  Ultrasound came back negative.  She has been having right lower extremity swelling.  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  We will place referral per patient request to vascular today.  We discussed that most likely this is related to the herniated disc. Denies f/c/s, n/v/d, hemoptysis, PND, leg swelling Denies chest pain or edema     Observations/Objective:     08/23/2024    2:26 PM 08/23/2024    2:25 PM 08/11/2024    1:22 PM  Vitals with BMI  Height   5' 3  Weight 130 lbs  133 lbs  BMI 23.03  23.57  Systolic  136 144  Diastolic  85 87  Pulse  71 75    Right lower leg swelling  Assessment and Plan:  1. 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 The patient was provided an opportunity to ask questions and all were answered. The patient agreed with the plan and demonstrated an understanding of the instructions.   The patient was advised to call back or seek an in-person evaluation if the symptoms worsen or if the condition fails to improve as anticipated.  I provided 23 minutes of  non-face-to-face time during this encounter.   Bascom GORMAN Borer, NP

## 2024-10-19 ENCOUNTER — Ambulatory Visit (HOSPITAL_BASED_OUTPATIENT_CLINIC_OR_DEPARTMENT_OTHER)
Admission: RE | Admit: 2024-10-19 | Discharge: 2024-10-19 | Disposition: A | Discharge status: Home / Self Care | Source: Ambulatory Visit | Attending: Acute Care | Admitting: Acute Care

## 2024-10-19 DIAGNOSIS — Z122 Encounter for screening for malignant neoplasm of respiratory organs: Secondary | ICD-10-CM | POA: Diagnosis present

## 2024-10-19 DIAGNOSIS — Z87891 Personal history of nicotine dependence: Secondary | ICD-10-CM | POA: Insufficient documentation

## 2024-10-19 NOTE — Progress Notes (Unsigned)
 "    10/20/2024 Michele [name] [id number] Aug 01, 1951  Referring provider: Oley Bascom RAMAN, NP Primary GI doctor: Dr. Charlanne  ASSESSMENT AND PLAN:  Personal history of colon polyps 01/19/2004 colonoscopy Dr. Dyane with [facility], states she was suppose to have recall 5 years but did not happen due to taking care of her mother and father Denies hematochezia, changes in [facility], has BM daily Getting MRI on Feb 4th, may need surgery for stenosis, having some neuropathy and urine issues, will be difficult to prep at this time.  No alarm symptoms PLAN: - will try to get colonoscopy report -Will set up follow up in 3 months to discuss setting up colonoscopy at that time, We have discussed the risks of bleeding, infection, perforation, medication reactions, and remote risk of death associated with colonoscopy. All questions were answered and the patient acknowledges these risk and wishes to proceed.  Grade 1 diastolic dysfunction 2015 echo EF 65 to 70% grade 1 diastolic dysfunction unremarkable valves 2024 CT coronary RADS score 2 nonobstructive plaque.  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 female with a past medical history listed below presents for evaluation of colonoscopy.   Discussed the use of AI scribe software for clinical note transcription with the patient, who gave verbal consent to proceed.  History of Present Illness   Michele [name] is a 74 year old female with prior colonic polyps who presents for  colonoscopy surveillance.  Last colonoscopy was in 2005 with Dr. Dyane at [facility], which revealed a couple of tiny polyps. She was advised to repeat the colonoscopy in five years but was unable to do so due to caregiving responsibilities. No family history of colon cancer. No changes in bowel habits, diarrhea, constipation, or blood in the stool. Reports regular morning bowel movements and no issues with complete evacuation. No prior issues with anesthesia.  She reports significant mobility limitations and has a history of spinal stenosis. Underwent cervical fusion and laminoplasty in 2016 for cervical stenosis and now has lumbar stenosis, resulting in difficulty ambulating. Experiences unilateral neuropathy with falls due to numbness, pins and needles sensations, and pain that worsens with weight-bearing. Awaiting two MRIs scheduled for February 4. Concerned about possible paralysis, as similar symptoms previously affected her arm and hand before her last back surgery. She reports significant difficulty ambulating and only leaves home when necessary.  Reports chronic rhinorrhea and is concerned about a possible relationship to prior cervical surgery. Also describes new severe headaches.  Describes bladder or kidney issues, including pressure, tenderness, and urinary leakage without sensation, and is unsure if this is related to spinal stenosis.  Underwent a CT scan of the lungs yesterday as a screening test due to a history of smoking. Denies chest pain, shortness of breath, heartburn, nausea, or trouble swallowing pills.  Medical history includes hypertension and thyroid  problems. Not currently taking blood thinners.        She  reports that she quit smoking about 12 years ago. Her smoking use included cigarettes. She started smoking about 37 years ago.  She has a 25 pack-year smoking history. She has never used smokeless tobacco. She reports that she does not drink alcohol and does not use  drugs.  RELEVANT GI HISTORY, IMAGING AND LABS: Results   Radiology CT coronary calcium  score (2024): Nonobstructive coronary artery plaque; calcium  score 92      CBC    Component Value Date/Time   WBC 9.3 07/15/2024 1518   WBC 7.5 12/12/2013 1344   RBC 4.40 07/15/2024 1518   RBC 4.14 12/12/2013 1344   HGB 14.4 07/15/2024 1518   HCT 42.9 07/15/2024 1518   PLT 294 07/15/2024 1518   MCV 98 (H) 07/15/2024 1518   MCH 32.7 07/15/2024 1518   MCH 32.6 12/12/2013 1344   MCHC 33.6 07/15/2024 1518   MCHC 34.0 12/12/2013 1344   RDW 13.2 07/15/2024 1518   Recent Labs    07/15/24 1518  HGB 14.4    CMP     Component Value Date/Time   NA 137 07/15/2024 1518   K 4.5 07/15/2024 1518   CL 98 07/15/2024 1518   CO2 24 07/15/2024 1518   GLUCOSE 84 07/15/2024 1518   GLUCOSE 113 (H) 12/12/2013 1344   BUN 17 07/15/2024 1518   CREATININE 0.95 07/15/2024 1518   CALCIUM  9.5 07/15/2024 1518   PROT 6.9 07/15/2024 1518   ALBUMIN 4.4 07/15/2024 1518   AST 27 07/15/2024 1518   ALT 16 07/15/2024 1518   ALKPHOS 71 07/15/2024 1518   BILITOT 0.5 07/15/2024 1518   GFRNONAA >90 12/12/2013 1344   GFRAA >90 12/12/2013 1344      Latest Ref Rng & Units 07/15/2024    3:18 PM 06/04/2023   10:52 AM 03/30/2023   10:00 AM  Hepatic Function  Total Protein 6.0 - 8.5 g/dL 6.9  7.1  6.9   Albumin 3.8 - 4.8 g/dL 4.4  4.7  4.5   AST 0 - 40 IU/L 27  16  14    ALT 0 - 32 IU/L 16  5  8    Alk Phosphatase 49 - 135 IU/L 71  68  75   Total Bilirubin 0.0 - 1.2 mg/dL 0.5  0.3  0.3       Current Medications:   Current Outpatient Medications (Cardiovascular):    atorvastatin  (LIPITOR) 10 MG tablet, Take 1 tablet (10 mg total) by mouth daily.   propranolol  (INDERAL ) 40 MG tablet, Take 1 tablet (40 mg total) by mouth every 8 (eight) hours.  Current Outpatient Medications (Respiratory):    cetirizine (ZYRTEC) 10 MG tablet, Take 10 mg by mouth as needed.   fluticasone (FLONASE) 50 MCG/ACT nasal spray, Place 1  spray into both nostrils as needed.  Current Outpatient Medications (Analgesics):    aspirin  EC 81 MG tablet, Take 1 tablet (81 mg total) by mouth daily. Swallow whole.   HYDROcodone -acetaminophen  (NORCO) 10-325 MG tablet, Take 1 tablet by mouth every 4 (four) hours as needed for pain.   HYDROcodone -acetaminophen  (NORCO) 10-325 MG tablet, Take 1 tablet by mouth every 4 (four) hours as needed for pain.   HYDROcodone -acetaminophen  (NORCO) 10-325 MG tablet, Take 1 tablet by mouth every 4 (four) hours as needed for pain.  Current Outpatient Medications (Hematological):    folic acid (FOLVITE) 400 MCG tablet, Take 400 mcg by mouth daily.  Current Outpatient Medications (Other):    ALPRAZolam  (XANAX ) 0.5 MG tablet, Take 1 tablet (0.5 mg total) by mouth 3 (three) times daily as needed.   diclofenac Sodium (VOLTAREN) 1 % GEL, Apply 2 g topically  as needed.   naloxone  (NARCAN ) nasal spray 4 mg/0.1 mL, Place 1 spray into one nostril as needed. May repeat dose every 2-3 minutes until patient is responsive or EMS arrives.   PARoxetine  (PAXIL ) 10 MG tablet, Take 1 tablet (10 mg total) by mouth daily.   PARoxetine  (PAXIL ) 10 MG tablet, Take 1 tablet (10 mg total) by mouth daily.   PARoxetine  (PAXIL ) 10 MG tablet, Take 1 tablet (10 mg total) by mouth daily.   PARoxetine  (PAXIL ) 20 MG tablet, Take 1 tablet (20 mg total) by mouth daily.   PARoxetine  (PAXIL ) 20 MG tablet, Take 1 tablet (20 mg total) by mouth daily.   PARoxetine  (PAXIL ) 20 MG tablet, Take 1 tablet (20 mg total) by mouth daily.   pregabalin  (LYRICA ) 75 MG capsule, Take 1 capsule (75 mg total) by mouth 2 (two) times daily.  Medical History:  Past Medical History:  Diagnosis Date   Allergy 1978   Anxiety 1965   Removed from parents   Arthritis 30s   Cataract 2016   Depression 1974   After divorce   Dysrhythmia    tachy episodes   Endometriosis    Heart murmur 1960s   Herniated disc    L5-S1   History of kidney stones    History of  orthostatic hypotension 08/31/2013   History of phlebitis    from IV   Hypertension 2020   Interstitial cystitis    Lumbar spondylosis    Multiple thyroid  nodules    Neuromuscular disorder (HCC) 1980   Peripheral vascular disease 09/22/1976   phlebitis lft arm- iv site   Phlebitis    Pneumonia    hx   PONV (postoperative nausea and vomiting)    last surgery okay   Shortness of breath    exersion   Spinal stenosis    Stroke (HCC) 05   tia   Tuberculosis    no tb but test pos   Ulcer 2005   Vioxx   Unspecified disorder of adrenal glands    questionable problem to be investigated   Allergies: Allergies[1]   Surgical History:  She  has a past surgical history that includes Cesarean section (09/23/1983); Total abdominal hysterectomy w/ bilateral salpingoophorectomy (09/22/2002); Tonsillectomy and adenoidectomy (09/22/1956); Lithotripsy; Nasal sinus surgery (09/22/1994); Vaginal birth after Cesarean section (09/22/1984); Laminotomy (09/22/2002); Thyroidectomy (Right, 12/16/2013); Thyroidectomy (Right, 12/16/2013); Tubal ligation (1985); Abdominal hysterectomy (2004); Eye surgery (2019); and Spine surgery. Family History:  Her family history includes ADD / ADHD in her son; Anxiety disorder in her mother; Arthritis in her mother; Breast cancer in her sister; CVA (age of onset: 24) in her paternal grandfather; CVA (age of onset: 60) in her paternal grandmother; Cancer in her father, mother, and sister; Cervical cancer in her mother; Fibromyalgia in her sister; Goiter in her mother; Gout in her father; Heart attack in her maternal grandmother, paternal grandfather, and paternal grandmother; Heart attack (age of onset: 33) in her sister; Heart attack (age of onset: 39) in her maternal grandfather; Heart disease in her father, mother, and sister; Hemochromatosis in her brother; Hepatitis C in her sister; Hyperlipidemia in her maternal grandfather, maternal grandmother, and paternal grandmother;  Hypertension in her father, maternal grandfather, maternal grandmother, mother, paternal grandfather, paternal grandmother, and sister; Hypothyroidism in her father; Kidney disease in her father; Prostate cancer in her father.  REVIEW OF SYSTEMS  : All other systems reviewed and negative except where noted in the History of Present Illness.  PHYSICAL EXAM: BP (!) 140/82  Pulse 76   Ht 5' 3 (1.6 m)   Wt 133 lb 4 oz (60.4 kg)   BMI 23.60 kg/m  Physical Exam   GENERAL APPEARANCE: Well nourished, in no apparent distress. HEENT: No cervical lymphadenopathy, unremarkable thyroid , sclerae anicteric, conjunctiva pink. RESPIRATORY: Respiratory effort normal, breath sounds clear to auscultation bilaterally without rales, rhonchi, or wheezing. CARDIO: Regular rate and rhythm with no murmurs, rubs, or gallops, peripheral pulses intact. ABDOMEN: Soft, non-distended, active bowel sounds in all four quadrants, mild tenderness to palpation lower AB, no rebound, no mass appreciated. RECTAL: Declines. MUSCULOSKELETAL: Full range of motion, antalgic gait with cane, able to get on the table, without edema, cervical kyphosis noted. SKIN: Dry, intact without rashes or lesions. No jaundice. NEURO: Alert, oriented, no focal deficits. PSYCH: Cooperative, normal mood and affect. NECK: Neck range of motion limited with pain.      Alan JONELLE Coombs, PA-C 11:29 AM      [1]  Allergies Allergen Reactions   Nsaids Other (See Comments)    GI problems    Iodine Rash   Latex Rash   Shellfish Allergy Other (See Comments)    Blood pressure drops   "

## 2024-10-20 ENCOUNTER — Encounter: Payer: Self-pay | Admitting: Physician Assistant

## 2024-10-20 ENCOUNTER — Other Ambulatory Visit

## 2024-10-20 ENCOUNTER — Ambulatory Visit: Admitting: Physician Assistant

## 2024-10-20 VITALS — BP 140/82 | HR 76 | Ht 63.0 in | Wt 133.2 lb

## 2024-10-20 DIAGNOSIS — Z006 Encounter for examination for normal comparison and control in clinical research program: Secondary | ICD-10-CM

## 2024-10-20 DIAGNOSIS — M4802 Spinal stenosis, cervical region: Secondary | ICD-10-CM

## 2024-10-20 DIAGNOSIS — I5030 Unspecified diastolic (congestive) heart failure: Secondary | ICD-10-CM

## 2024-10-20 DIAGNOSIS — Z860100 Personal history of colon polyps, unspecified: Secondary | ICD-10-CM

## 2024-10-20 DIAGNOSIS — R2681 Unsteadiness on feet: Secondary | ICD-10-CM

## 2024-10-20 LAB — GENECONNECT MOLECULAR SCREEN

## 2024-10-20 NOTE — Patient Instructions (Addendum)
 Please call Bascom and discuss your back.  May need to rule out cauda equina syndrome, if symptoms get worse please go to the ER.   Will set up follow up in 3 months for previsit for the colonoscopy  I appreciate the  opportunity to care for you  Thank You    Amanda Collier,PA-C

## 2024-10-24 ENCOUNTER — Other Ambulatory Visit: Payer: Self-pay

## 2024-10-24 DIAGNOSIS — Z87891 Personal history of nicotine dependence: Secondary | ICD-10-CM

## 2024-10-24 DIAGNOSIS — Z122 Encounter for screening for malignant neoplasm of respiratory organs: Secondary | ICD-10-CM

## 2024-12-26 ENCOUNTER — Encounter

## 2024-12-26 ENCOUNTER — Ambulatory Visit (HOSPITAL_COMMUNITY)

## 2025-01-13 ENCOUNTER — Ambulatory Visit: Payer: Self-pay | Admitting: Nurse Practitioner
